# Patient Record
Sex: Female | Born: 1975 | Race: White | Hispanic: No | Marital: Married | Smoking: Former smoker
Health system: Southern US, Community
[De-identification: ages and names within clinical notes are randomized; demographics above are authoritative.]

## PROBLEM LIST (undated history)

## (undated) DIAGNOSIS — D649 Anemia, unspecified: Secondary | ICD-10-CM

## (undated) DIAGNOSIS — Z973 Presence of spectacles and contact lenses: Secondary | ICD-10-CM

## (undated) DIAGNOSIS — F329 Major depressive disorder, single episode, unspecified: Secondary | ICD-10-CM

## (undated) DIAGNOSIS — T7840XA Allergy, unspecified, initial encounter: Secondary | ICD-10-CM

## (undated) DIAGNOSIS — F419 Anxiety disorder, unspecified: Secondary | ICD-10-CM

## (undated) DIAGNOSIS — N920 Excessive and frequent menstruation with regular cycle: Secondary | ICD-10-CM

## (undated) DIAGNOSIS — R112 Nausea with vomiting, unspecified: Secondary | ICD-10-CM

## (undated) DIAGNOSIS — F32A Depression, unspecified: Secondary | ICD-10-CM

## (undated) DIAGNOSIS — J302 Other seasonal allergic rhinitis: Secondary | ICD-10-CM

## (undated) DIAGNOSIS — L309 Dermatitis, unspecified: Secondary | ICD-10-CM

## (undated) DIAGNOSIS — F411 Generalized anxiety disorder: Secondary | ICD-10-CM

## (undated) DIAGNOSIS — Z9889 Other specified postprocedural states: Secondary | ICD-10-CM

## (undated) HISTORY — DX: Depression, unspecified: F32.A

## (undated) HISTORY — DX: Major depressive disorder, single episode, unspecified: F32.9

## (undated) HISTORY — DX: Dermatitis, unspecified: L30.9

## (undated) HISTORY — PX: OTHER SURGICAL HISTORY: SHX169

## (undated) HISTORY — DX: Allergy, unspecified, initial encounter: T78.40XA

---

## 1997-11-26 ENCOUNTER — Emergency Department (HOSPITAL_COMMUNITY): Admission: EM | Admit: 1997-11-26 | Discharge: 1997-11-26 | Payer: Self-pay | Admitting: Emergency Medicine

## 2000-05-19 ENCOUNTER — Other Ambulatory Visit: Admission: RE | Admit: 2000-05-19 | Discharge: 2000-05-19 | Payer: Self-pay | Admitting: Obstetrics and Gynecology

## 2001-06-29 ENCOUNTER — Other Ambulatory Visit: Admission: RE | Admit: 2001-06-29 | Discharge: 2001-06-29 | Payer: Self-pay | Admitting: Obstetrics and Gynecology

## 2005-09-08 ENCOUNTER — Ambulatory Visit: Payer: Self-pay | Admitting: Internal Medicine

## 2005-10-05 ENCOUNTER — Ambulatory Visit: Payer: Self-pay | Admitting: Internal Medicine

## 2005-10-19 ENCOUNTER — Ambulatory Visit: Payer: Self-pay | Admitting: Internal Medicine

## 2006-08-25 ENCOUNTER — Ambulatory Visit: Payer: Self-pay | Admitting: Family Medicine

## 2006-09-22 ENCOUNTER — Ambulatory Visit: Payer: Self-pay | Admitting: Internal Medicine

## 2006-11-03 ENCOUNTER — Ambulatory Visit: Payer: Self-pay | Admitting: Internal Medicine

## 2007-07-27 ENCOUNTER — Ambulatory Visit: Payer: Self-pay | Admitting: Internal Medicine

## 2007-07-27 DIAGNOSIS — J019 Acute sinusitis, unspecified: Secondary | ICD-10-CM

## 2007-07-27 DIAGNOSIS — H6980 Other specified disorders of Eustachian tube, unspecified ear: Secondary | ICD-10-CM

## 2007-07-27 DIAGNOSIS — L259 Unspecified contact dermatitis, unspecified cause: Secondary | ICD-10-CM | POA: Insufficient documentation

## 2007-07-27 DIAGNOSIS — F3289 Other specified depressive episodes: Secondary | ICD-10-CM | POA: Insufficient documentation

## 2007-07-27 DIAGNOSIS — F329 Major depressive disorder, single episode, unspecified: Secondary | ICD-10-CM

## 2007-11-12 ENCOUNTER — Ambulatory Visit: Payer: Self-pay | Admitting: Internal Medicine

## 2007-11-12 DIAGNOSIS — H109 Unspecified conjunctivitis: Secondary | ICD-10-CM | POA: Insufficient documentation

## 2009-08-22 LAB — CONVERTED CEMR LAB: Pap Smear: NORMAL

## 2010-05-30 ENCOUNTER — Emergency Department (HOSPITAL_COMMUNITY): Admission: EM | Admit: 2010-05-30 | Discharge: 2010-05-30 | Payer: Self-pay | Admitting: Emergency Medicine

## 2010-06-01 ENCOUNTER — Telehealth: Payer: Self-pay | Admitting: Internal Medicine

## 2010-07-22 ENCOUNTER — Telehealth: Payer: Self-pay | Admitting: Internal Medicine

## 2010-08-06 ENCOUNTER — Ambulatory Visit: Payer: Self-pay | Admitting: Internal Medicine

## 2010-08-06 LAB — CONVERTED CEMR LAB
ALT: 10 units/L (ref 0–35)
AST: 11 units/L (ref 0–37)
Albumin: 3.4 g/dL — ABNORMAL LOW (ref 3.5–5.2)
Alkaline Phosphatase: 40 units/L (ref 39–117)
Basophils Relative: 0.5 % (ref 0.0–3.0)
Bilirubin, Direct: 0.1 mg/dL (ref 0.0–0.3)
CO2: 25 meq/L (ref 19–32)
Calcium: 8.7 mg/dL (ref 8.4–10.5)
Chloride: 110 meq/L (ref 96–112)
Creatinine, Ser: 0.6 mg/dL (ref 0.4–1.2)
Eosinophils Relative: 1.7 % (ref 0.0–5.0)
Hemoglobin: 13.1 g/dL (ref 12.0–15.0)
Ketones, ur: NEGATIVE mg/dL
LDL Cholesterol: 110 mg/dL — ABNORMAL HIGH (ref 0–99)
Leukocytes, UA: NEGATIVE
Lymphocytes Relative: 33.1 % (ref 12.0–46.0)
MCHC: 34.4 g/dL (ref 30.0–36.0)
Neutro Abs: 4.7 10*3/uL (ref 1.4–7.7)
Neutrophils Relative %: 57.2 % (ref 43.0–77.0)
Nitrite: NEGATIVE
RBC: 4.32 M/uL (ref 3.87–5.11)
Sodium: 141 meq/L (ref 135–145)
Specific Gravity, Urine: 1.025 (ref 1.000–1.030)
Total CHOL/HDL Ratio: 5
Total Protein: 6.3 g/dL (ref 6.0–8.3)
Urobilinogen, UA: 0.2 (ref 0.0–1.0)
WBC: 8.3 10*3/uL (ref 4.5–10.5)
pH: 5.5 (ref 5.0–8.0)

## 2010-08-12 ENCOUNTER — Ambulatory Visit: Payer: Self-pay | Admitting: Internal Medicine

## 2010-08-12 DIAGNOSIS — J309 Allergic rhinitis, unspecified: Secondary | ICD-10-CM | POA: Insufficient documentation

## 2010-08-12 DIAGNOSIS — E786 Lipoprotein deficiency: Secondary | ICD-10-CM | POA: Insufficient documentation

## 2010-09-21 NOTE — Progress Notes (Signed)
Summary: ED Visit on 05/31/10   Michele Andersen, Matalie Romberger - MRN: [id number] Acct#: [id number] PHYSICIAN DOCUMENTATION Derrick Ravel Oct 09 16:57:28 EDT 2011 [facility] [address] [place], [place] [address] PHONE: [phone] MRN: [id number] Account #: [id number] Name: Michele Andersen, Michele Andersen Transue Sex: F Age: 35 DOB: 1976-03-31 Complaint: Headache Primary Diagnosis: Headache Arrival Time: 05/30/2010 14:23 Discharge Time: 05/30/2010 16:56 All Providers: Ms. Jaynie Crumble - PA; Dr. Linwood Dibbles - MD PROVIDER: Ms. Jaynie Crumble - PA HPI: The patient is a 35 year old female who presents with a chief complaint of headache. The history was provided by the patient. The patient was seen at 03:02 PM. Pt states she has had headache for last 4 days. States she woke up with the headache, headache comes and goes, but is there most of the day. Headache mainly in the back of the head, also has neck stiffness. Denies fever, chills, URI symptoms, malaise, body aches, changes in vision, neurological complaints. States went to the UC, but sent here for CT scan due to pain in the back of the head, neck stiffness, and temp of 99.2. Pt admits that headache started with her menses, and states she always gets headache during her menstrual cycle. States though, they usually resolve in 1-2 days, and are nrever this long. Tried tylenol and ibuprofen, however, did not take anything today. The headache started 4 day(s) ago. The headache was preceded by nothing. The onset was unknown. The Pattern is persistent. The Course is persistent. The headache is located in the bilateral back of head. It is characterized as throbbing. The patient's pain was 3 out of 10 at its worst. The patient's pain is 3 out of 10 now. The symptoms are described as moderate. The condition is aggravated by nothing. The condition is relieved by nothing. The symptoms have been associated with stiff neck, while the symptoms have not been associated with  dizziness, fever, focal neurological deficit, nausea, photophobia, sinus pain, syncope, visual disturbance or vomiting. The patient has no significant history of serious medical conditions. 15:10 05/30/2010 by Jaynie Crumble - PA, Ms. ROS: Statement: all systems negative except as marked or noted in the HPI 15:26 05/30/2010 by Jaynie Crumble - PA, Ms. PMH: Documentation: physician assistant reviewed/amended Historian: patient Patient's Current Physicians Patient's Current Physicians (please list PCP first) Panosh - IM, Neta Mends Past medical history: none Social History: non-smoker, no drug abuse, occasional drinker 1 Michele Andersen, Tykisha Areola - MRN: [id number] Acct#: [id number] Allergies Drug Reaction Allergy Note Other (see note) thimeziol 15:10 05/30/2010 by Jaynie Crumble - PA, Ms. Home Medications: Documentation: physician assistant reviewed/amended Medications Medication [Medication] Dosage Frequency Last Dose Junel FE 1/20 (28) Oral once a day ZyrTEC Oral once a day 15:10 05/30/2010 by Jaynie Crumble - PA, Ms. Physical examination: Vital signs and O2 SAT: reviewed, normal Constitutional: well developed, well nourished, in no acute distress Head and Face: normocephalic, atraumatic Eyes: normal appearance, EOMI, PERRL, fundiscopic exam normal ENMT Exam: Lips: normal Mouth: mucosa membrane normal Throat: normal Nose: bilateral nasal mucosa normal Sinuses: bilateral normal External Ear: bilateral normal Ear canal: bilateral normal Tympanic membrane: bilateral normal Neck: full range of motion, no meningeal signs, no lymphadenopathy, no carotid bruit Spine: NOTE - mild tenderness over left back of the neck, no cervical midline tenderness Cardiovascular: regular rate and rhythm, no murmur, rub, or gallop Respiratory: breath sounds clear & equal bilaterally, no rales, rhonchi, wheezes, or rub Chest: nontender, no deformity Abdomen: soft, nontender, nondistended,  normal bowel sounds Extremities: normal Neuro: AA&Ox3,  Cranial Nerves II-XII intact, motor intact in all extremities, sensation normal , normal reflexes, gait normal, normal coordination, normal speech, no nystagmus, no facial droop, no pronator drift Skin: color normal, no rash, no petechiae, warm, dry Psychiatric: AA&Ox4, no abnormalities of mood or affect 15:26 05/30/2010 by Jaynie Crumble - PA, Ms. Reviewed result: Result Type: Cleda Daub: [id number] 2 Michele Andersen, Nekeshia Lenhardt - MRN: [id number] Acct#: [id number] Step Type: LAB Procedure Name: URINE MACROSCOPIC Procedure: URINE MACROSCOPIC Result: URINE COLOR YELLOW [YELLOW] URINE APPEARANCE CLEAR [CLEAR] URINE SPEC GRAVITY 1.006 [1.005-1.030] URINE PH 7.0 [5.0-8.0] URINE GLUCOSE NEGATIVE mg/dL [NEG] URINE HEMOGLOBIN NEGATIVE [NEG] URINE BILIRUBIN NEGATIVE [NEG] URINE KETONES NEGATIVE mg/dL [NEG] URINE TOTAL PROTEIN NEGATIVE mg/dL [NEG] URINE UROBILINOGEN 0.2 mg/dL [8.4-1.3] URINE NITRITE NEGATIVE [NEG] LEUKOCYTE ESTERASE TRACE [NEG] A 15:16 05/30/2010 by Jaynie Crumble - PA, Ms. Reviewed result: Result Type: Cleda Daub: [id number] Step Type: LAB Procedure Name: URINE MICROSCOPIC Procedure: URINE MICROSCOPIC Result: URINE EPITHELIAL RARE [RARE] URINE WBC'S 0-2 WBC/hpf [<3] URINE RBC'S 0-2 RBC/hpf [<3] 15:16 05/30/2010 by Jaynie Crumble - PA, Ms. Reviewed result: Result Type: Cleda Daub: [id number] Step Type: XRAY Procedure Name: CT HEAD W/O CM Procedure: CT HEAD W/O CM Result: Clinical Data: Headache. CT HEAD WITHOUT CONTRAST Technique: Contiguous axial images were obtained from the base of the skull through the vertex without intravenous contrast. 3 Michele Andersen, Lucynda Rosano - MRN: [id number] Acct#: [id number] Comparison: None. Findings: The brain appears normal without evidence of acute infarction, hemorrhage, mass lesion, mass effect, midline shift or abnormal extra-axial fluid collection.  No hydrocephalus. Calvarium intact. Imaged paranasal sinuses and mastoid air cells clear. IMPRESSION: Negative exam. 15:28 05/30/2010 by Jaynie Crumble - PA, Ms. Reviewed result: Result Type: Cleda Daub: [id number] Step Type: LAB Procedure Name: URINE PREGNANCY Procedure: URINE PREGNANCY Procedure Notes: URINE PREGNANCY - THE SENSITIVITY OF THIS METHODOLOGY IS >24 mIU/mL Result: URINE PREGNANCY NEGATIVE 15:53 05/30/2010 by Jaynie Crumble - PA, Ms. Patient disposition: Patient disposition: Disch - Home Primary Diagnosis: headache Counseling: advised of diagnosis, advised of treatment plan, advised of xray and lab findings, advised of need for close follow-up, advised of need to return for worsening or changing symptoms, patient voices understanding 16:25 05/30/2010 by Jaynie Crumble - PA, Ms. Prescriptions: Prescription Medication Dispense Sig Line ibuprofen 800 mg Tab Twelve (12) One tablet PO TIDTake it with meals UltRAM 50 mg Tab Twenty (20) One-Two tablet PO Q6hrs prn painDo not exceed 400 mg per day 16:25 05/30/2010 by Jaynie Crumble - PA, Ms. 4 Michele Andersen, Melannie Metzner - MRN: [id number] Acct#: [id number] Medication disposition: Medications Medication [Medication] Dosage Frequency Last Dose Medication disposition PCP contact Junel FE 1/20 (28) Oral once a day continue ZyrTEC Oral once a day continue 16:25 05/30/2010 by Jaynie Crumble - PA, Ms. Discharge: Discharge Instructions: *free text, headache Append a Note to Discharge Instructions: Your CT of the head is normal today. Take ultram and ibuprofen as prescribed as needed for pain. Drink plenty of fluids. Rest. Return immediatly if fever over 100, nausea, vomiting, increased pain, confusion, difficulty walking, talking, weakness, unable to move your neck. Follow up with your doctor. Referral/Appointment Refer Patient To: Phone Number: Follow-up in Appointment Details: Lajuana Ripple [phone] Drug Instructions: pain nsaid motrin, pain ultram 16:27 05/30/2010 by Jaynie Crumble - PA, Ms. PROVIDER: Dr. Linwood Dibbles - MD Attending: Supervision of: Midlevel: I have personally performed and participated in all the services and procedures documented herein. I have reviewed the findings with the patient. Comments: Pt feeling better after treatment.  Ready for discharge 16:37 05/30/2010 by Linwood Dibbles - MD, Dr. Bertram Millard

## 2010-09-21 NOTE — Progress Notes (Signed)
Summary: REQUEST FOR CPX APPT (Before end of 2011)  Phone Note Call from Patient   Caller: Patient  [phone] Summary of Call: Pt called to adv that she is leaving her job at the end of the year and wants to have a cpx before she leaves her job due to insurance ending.... Pt would like to be worked into schedule before end of year if possible.... Pt can be reached at [phone] to advise.  Initial call taken by: Debbra Riding,  July 22, 2010 9:31 AM  Follow-up for Phone Call        please make cpx appt. KIK Follow-up by: Duard Brady LPN,  July 22, 2010 12:20 PM  Additional Follow-up for Phone Call Additional follow up Details #1::        Okay to schedule pt anywhere I can find slots to combine for M30 appt / cpx ?  Additional Follow-up by: Debbra Riding,  July 22, 2010 2:39 PM    Additional Follow-up for Phone Call Additional follow up Details #2::    not on a friday afternoon or the  afternoon after a holiday   yes otherwise Follow-up by: Madelin Headings MD,  July 23, 2010 8:24 AM  Additional Follow-up for Phone Call Additional follow up Details #3:: Details for Additional Follow-up Action Taken: [facility] so appt can be scheduled for cpx.  Pt was scheduled for cpx labwork on 12/16.... cpx on 12/22, pt will arrive at 10:45am - adv no pap.Marland KitchenMarland KitchenPt acknowledged same.... Debbra Riding  July 23, 2010 11:08 AM  Additional Follow-up by: Debbra Riding,  July 23, 2010 9:31 AM   Appended Document: REQUEST FOR CPX APPT (Before end of 2011) The following note (flag) was sent to Dr Fabian Sharp:  This pt was scheduled for cpx on 12/22 .Marland Kitchen.. will arrive at 10:45am...Marland KitchenMarland Kitchen pt adv this is the only day that she has off (only available day to come in before end of year).  Please review when you get a chance and let me know if there is a problem so I can call pt to advise.  Thanks,  Bjorn Loser

## 2010-09-23 NOTE — Assessment & Plan Note (Signed)
Summary: CPX, NO PAP - PT WILL ARRIVE AT 10:45AM - OK PER DR [facility] // RS   Vital Signs:  Patient profile:   35 year old female Menstrual status:  regular LMP:     07/17/2010 Height:      63 inches Weight:      156 pounds BMI:     27.73 Pulse rate:   60 / minute BP sitting:   120 / 80  (left arm) Cuff size:   regular  Vitals Entered By: Romualdo Bolk, CMA Duncan Dull) (August 12, 2010 10:57 AM)  Nutrition Counseling: Patient's BMI is greater than 25 and therefore counseled on weight management options. CC: CPX without pap- Pt has a gyn who does paps. LMP (date): 07/17/2010     Menstrual Status regular Enter LMP: 07/17/2010 Last PAP Result normal   History of Present Illness: Michele Andersen comes in today  for preventive visit .  Since last visit  here  there have been no major changes in health status  .   Mood stable Skin see ros  does itch at times. On ocps  no concerns    getting married next month.  Preventive Care Screening  Pap Smear:    Date:  08/22/2009    Results:  normal    Preventive Screening-Counseling & Management  Alcohol-Tobacco     Alcohol drinks/day: <1     Alcohol type: beer     Smoking Status: quit     Year Quit: 2004  Caffeine-Diet-Exercise     Caffeine use/day: 1     Does Patient Exercise: no  Hep-HIV-STD-Contraception     Dental Visit-last 6 months yes     Sun Exposure-Excessive: no  Safety-Violence-Falls     Seat Belt Use: yes     Firearms in the Home: no firearms in the home     Smoke Detectors: yes  Current Medications (verified): 1)  Yaz 3-0.02 Mg  Tabs (Drospirenone-Ethinyl Estradiol) 2)  Zyrtec Hives Relief 10 Mg Tabs (Cetirizine Hcl)  Allergies (verified): 1)  ! * Thimerasol  Past History:  Past medical, surgical, family and social histories (including risk factors) reviewed, and no changes noted (except as noted below).  Past Medical History: Depression eczema  HA eval in ed  neg ct scan Allergic rhinitis fall     Past Surgical History: Reviewed history from 07/27/2007 and no changes required. Stitches-Rt pinkey Denies surgical history  Past History:  Care Management: Gynecology: [facility]  Family History: Reviewed history from 11/12/2007 and no changes required. [place] father and fathers side with dm both sides with elevated lipids  Social History: Reviewed history from 11/12/2007 and no changes required. 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   Alcohol use-yes Drug use-no Regular exercise-no HHof 2 and cat   no ets but hfiance smokes outside Caffeine use/day:  1 Dental Care w/in 6 mos.:  yes Sun Exposure-Excessive:  no Seat Belt Use:  yes  Review of Systems  The patient denies anorexia, fever, weight gain, vision loss, decreased hearing, hoarseness, chest pain, syncope, dyspnea on exertion, peripheral edema, prolonged cough, hemoptysis, abdominal pain, melena, hematochezia, severe indigestion/heartburn, hematuria, incontinence, muscle weakness, suspicious skin lesions, difficulty walking, unusual weight change, abnormal bleeding, enlarged lymph nodes, angioedema, and breast masses.         skin check moles  has had skin area aczema   and patch  on right  ankle derm said either ecz or psoriasis .  had HA and went to ed in October  felt from period  had neg ct scan.  depression is stable.  see hpi also  Physical Exam  General:  Well-developed,well-nourished,in no acute distress; alert,appropriate and cooperative throughout examination Head:  Normocephalic and atraumatic without obvious abnormalities. No apparent alopecia or balding. Eyes:  PERRL, EOMs full, conjunctiva clear  Ears:  External ear exam shows no significant lesions or deformities.  Otoscopic examination reveals clear canals, tympanic membranes are intact bilaterally without bulging, retraction, inflammation or discharge. Hearing is grossly  normal bilaterally. Nose:  no external deformity, no external erythema, and no nasal discharge.   Mouth:  good dentition and pharynx pink and moist.   Neck:  No deformities, masses, or tenderness noted. Breasts:  No mass, nodules, thickening, tenderness, bulging, retraction, inflamation, nipple discharge or skin changes noted.   Lungs:  Normal respiratory effort, chest expands symmetrically. Lungs are clear to auscultation, no crackles or wheezes.no dullness.   Heart:  Normal rate and regular rhythm. S1 and S2 normal without gallop, murmur, click, rub or other extra sounds.no lifts.   Abdomen:  Bowel sounds positive,abdomen soft and non-tender without masses, organomegaly or hernias noted. Genitalia:  per gyne Msk:  no joint tenderness, no joint warmth, and no redness over joints.   Pulses:  pulses intact without delay   Extremities:  no clubbing cyanosis or edema  Neurologic:  alert & oriented X3, strength normal in all extremities, gait normal, and DTRs symmetrical and normal.   Skin:  turgor normal and color normal.  sun frekling no susp lesions  right ankle wiht 4 cm round patch red and thickened  no silver scale   Cervical Nodes:  No lymphadenopathy noted Axillary Nodes:  No palpable lymphadenopathy Inguinal Nodes:  No significant adenopathy Psych:  Oriented X3, normally interactive, good eye contact, not anxious appearing, and not depressed appearing.     Impression & Recommendations:  Problem # 1:  Preventive Health Care (ICD-V70.0) Discussed nutrition,exercise,diet,healthy weight, vitamin D and calcium.   Problem # 2:  ECZEMA (ICD-692.9) vs psoriasis trail of steroid ointment Her updated medication list for this problem includes:    Zyrtec Hives Relief 10 Mg Tabs (Cetirizine hcl)    Mometasone Furoate 0.1 % Oint (Mometasone furoate) .Marland Kitchen... 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 Apply  to rash two times a day  as directed 3)  Junel 1.5/30 1.5-30 Mg-mcg Tabs (Norethindrone acet-ethinyl est) .... As per gyne  Other Orders: Tdap => 4yrs IM (16109) Admin 1st Vaccine (60454)  Patient Instructions: 1)  try steroid ointment right ankle as needed.  2)  healthy eating  avoiding transfats  saturated  fats refined sugars and carbs,  processed foods. 3)  exercise  can increase your HDL.  4)  Check up an follow up when needed.  5)  BMI goal guideline is 25 or below Prescriptions: MOMETASONE FUROATE 0.1 % OINT (MOMETASONE FUROATE) apply  to rash two times a day  as directed  #30 grm x 1   Entered and Authorized by:   Madelin Headings MD   Signed by:   Madelin Headings MD on 08/12/2010   Method used:   Electronically to        [facility]  [address]       [place], [place]  [address]       Ph: [phone]       Fax: [phone]   RxID:   [id number]    Orders Added: 1)  Tdap => 54yrs IM [90715] 2)  Admin 1st Vaccine [90471] 3)  Est. Patient 18-39 years [99395] 4)  Est. Patient Level II [40102]   Immunizations Administered:  Tetanus Vaccine:    Vaccine Type: Tdap    Site: right deltoid    Mfr: GlaxoSmithKline    Dose: 0.5 ml    Route: IM    Given by: Romualdo Bolk, CMA (AAMA)    Exp. Date: 06/10/2012    Lot #: [id number]   Immunizations Administered:  Tetanus Vaccine:    Vaccine Type: Tdap    Site: right deltoid    Mfr: GlaxoSmithKline    Dose: 0.5 ml    Route: IM    Given by: Romualdo Bolk, CMA (AAMA)    Exp. Date: 06/10/2012    Lot #: [id number]

## 2010-11-04 LAB — URINE MICROSCOPIC-ADD ON

## 2010-11-04 LAB — URINALYSIS, ROUTINE W REFLEX MICROSCOPIC
Hgb urine dipstick: NEGATIVE
Nitrite: NEGATIVE
Protein, ur: NEGATIVE mg/dL
Urobilinogen, UA: 0.2 mg/dL (ref 0.0–1.0)

## 2012-02-14 ENCOUNTER — Ambulatory Visit (INDEPENDENT_AMBULATORY_CARE_PROVIDER_SITE_OTHER): Payer: BC Managed Care – PPO | Admitting: Internal Medicine

## 2012-02-14 ENCOUNTER — Encounter: Payer: Self-pay | Admitting: Internal Medicine

## 2012-02-14 VITALS — BP 120/84 | HR 89 | Temp 98.6°F | Wt 162.0 lb

## 2012-02-14 DIAGNOSIS — M6740 Ganglion, unspecified site: Secondary | ICD-10-CM

## 2012-02-14 DIAGNOSIS — IMO0002 Reserved for concepts with insufficient information to code with codable children: Secondary | ICD-10-CM | POA: Insufficient documentation

## 2012-02-14 NOTE — Progress Notes (Signed)
  Subjective:    Patient ID: Michele Andersen, female    DOB: 01-01-1976, 36 y.o.   MRN: [id number]  HPI Patient comes in today  for  new problem evaluation. She has had about 2 months of a tender nodule at the base of her left ring finger palmar surface. It's gotten bigger slowly and is now becoming tender to touch and has pain and difficulty when gripping or such things such as the steering wheel in her car. She denies any trauma. She is right-handed. Is difficult to wear her rings on that side. Review of Systems Negative for hand trauma joint swelling. No numbness or particular weakness. Past history family history social history reviewed in the electronic medical record. Outpatient Encounter Prescriptions as of 02/14/2012  Medication Sig Dispense Refill  . cetirizine (ZYRTEC) 10 MG tablet Take 10 mg by mouth daily.      . Folic Acid-Vit B6-Vit B12 (FOLBEE) 2.5-25-1 MG TABS Take 1 tablet by mouth daily.      Marland Kitchen DISCONTD: norethindrone-ethinyl estradiol-iron (MICROGESTIN FE,GILDESS FE,LOESTRIN FE) 1.5-30 MG-MCG tablet Take 1 tablet by mouth daily.           Objective:   Physical Exam BP 120/84  Pulse 89  Temp 98.6 F (37 C) (Oral)  Wt 162 lb (73.483 kg)  SpO2 98%  LMP 01/26/2012  Well-developed well-nourished in no acute distress examination of left hand neurovascular appears intact no focal atrophy grip strength is good. There is a small modestly tender nodule at the base of left ring finger palmar surface with no deformity there's no redness. It seems to be mobile minimally.     Assessment & Plan:   Left palmar nodule finger ring possible ganglion causing symptoms and decreased function.  Refer to hand specialist discussion with patient will contact her about appointment.

## 2012-02-14 NOTE — Patient Instructions (Signed)
This may be a ganglion in a bad place . Someone will contact you about referral appt to a hand specialist.  Contact us prn in the meantime

## 2012-09-15 ENCOUNTER — Emergency Department (HOSPITAL_COMMUNITY): Payer: 59

## 2012-09-15 ENCOUNTER — Emergency Department (HOSPITAL_COMMUNITY)
Admission: EM | Admit: 2012-09-15 | Discharge: 2012-09-15 | Disposition: A | Discharge status: Home / Self Care | Payer: 59 | Attending: Emergency Medicine | Admitting: Emergency Medicine

## 2012-09-15 ENCOUNTER — Encounter (HOSPITAL_COMMUNITY): Payer: Self-pay | Admitting: *Deleted

## 2012-09-15 DIAGNOSIS — Z79899 Other long term (current) drug therapy: Secondary | ICD-10-CM | POA: Insufficient documentation

## 2012-09-15 DIAGNOSIS — Z8659 Personal history of other mental and behavioral disorders: Secondary | ICD-10-CM | POA: Insufficient documentation

## 2012-09-15 DIAGNOSIS — Z872 Personal history of diseases of the skin and subcutaneous tissue: Secondary | ICD-10-CM | POA: Insufficient documentation

## 2012-09-15 DIAGNOSIS — Z87891 Personal history of nicotine dependence: Secondary | ICD-10-CM | POA: Insufficient documentation

## 2012-09-15 DIAGNOSIS — N949 Unspecified condition associated with female genital organs and menstrual cycle: Secondary | ICD-10-CM | POA: Insufficient documentation

## 2012-09-15 DIAGNOSIS — R1031 Right lower quadrant pain: Secondary | ICD-10-CM | POA: Insufficient documentation

## 2012-09-15 DIAGNOSIS — Z3202 Encounter for pregnancy test, result negative: Secondary | ICD-10-CM | POA: Insufficient documentation

## 2012-09-15 LAB — URINALYSIS, ROUTINE W REFLEX MICROSCOPIC
Bilirubin Urine: NEGATIVE
Glucose, UA: NEGATIVE mg/dL
Hgb urine dipstick: NEGATIVE
Ketones, ur: NEGATIVE mg/dL
Protein, ur: NEGATIVE mg/dL

## 2012-09-15 LAB — POCT PREGNANCY, URINE: Preg Test, Ur: NEGATIVE

## 2012-09-15 LAB — CBC WITH DIFFERENTIAL/PLATELET
Eosinophils Absolute: 0.1 10*3/uL (ref 0.0–0.7)
Eosinophils Relative: 1 % (ref 0–5)
Hemoglobin: 13.6 g/dL (ref 12.0–15.0)
Lymphs Abs: 2.6 10*3/uL (ref 0.7–4.0)
MCH: 29.2 pg (ref 26.0–34.0)
MCV: 84.3 fL (ref 78.0–100.0)
Monocytes Relative: 7 % (ref 3–12)
RBC: 4.66 MIL/uL (ref 3.87–5.11)

## 2012-09-15 LAB — WET PREP, GENITAL: WBC, Wet Prep HPF POC: NONE SEEN

## 2012-09-15 LAB — BASIC METABOLIC PANEL
BUN: 7 mg/dL (ref 6–23)
CO2: 24 mEq/L (ref 19–32)
Calcium: 9.1 mg/dL (ref 8.4–10.5)
Glucose, Bld: 92 mg/dL (ref 70–99)
Potassium: 3.9 mEq/L (ref 3.5–5.1)
Sodium: 136 mEq/L (ref 135–145)

## 2012-09-15 MED ORDER — IBUPROFEN 800 MG PO TABS
800.0000 mg | ORAL_TABLET | Freq: Once | ORAL | Status: AC
  Administered 2012-09-15: 800 mg via ORAL
  Filled 2012-09-15: qty 1

## 2012-09-15 MED ORDER — TRAMADOL HCL 50 MG PO TABS: 50.0000 mg | ORAL_TABLET | Freq: Four times a day (QID) | ORAL | Status: DC | PRN

## 2012-09-15 NOTE — ED Provider Notes (Signed)
History     CSN: [id number]  Arrival date & time 09/15/12  1191   First MD Initiated Contact with Patient 09/15/12 [phone]      Chief Complaint  Patient presents with  . Abdominal Pain    (Consider location/radiation/quality/duration/timing/severity/associated sxs/prior treatment) HPI Comments: Patient is a 37 y/o female presenting to the ED c/o gradual onset abdominal pain beginning late last night. Patient noticed a full feeling described as "cramping" in the right lower quadrant yesterday. Pain is intermittent, sharp, non-radiating, rated as 5/10. Pain aggravated by movement. Nothing in specific alleviates her pain. She began her menses yesterday. Patient states that she took a course of Clomed in October and November of the past year since she and her husband are trying to get pregnant.  Patient's past medical history is significant for irregular periods and metorrhagia. Denies history of ovarian cysts. Denies vaginal pain or discharge, urinary symptoms, fever, chills, nausea, vomiting, diarrhea or appetite changes.  Patient is a 37 y.o. female presenting with abdominal pain. The history is provided by the patient. No language interpreter was used.  Abdominal Pain The primary symptoms of the illness include abdominal pain. The primary symptoms of the illness do not include fever, shortness of breath, nausea, vomiting, diarrhea, dysuria, vaginal discharge or vaginal bleeding.  Symptoms associated with the illness do not include chills, constipation, urgency, hematuria, frequency or back pain.    Past Medical History  Diagnosis Date  . Depression   . Eczema   . Allergy     History reviewed. No pertinent past surgical history.  Family History  Problem Relation Age of Onset  . Hyperlipidemia Mother   . Diabetes Father   . Hyperlipidemia Father     History  Substance Use Topics  . Smoking status: Former Games developer  . Smokeless tobacco: Not on file  . Alcohol Use: Yes    OB  History    Grav Para Term Preterm Abortions TAB SAB Ect Mult Living                  Review of Systems  Constitutional: Negative for fever, chills and appetite change.  Respiratory: Negative for shortness of breath.   Cardiovascular: Negative for chest pain.  Gastrointestinal: Positive for abdominal pain. Negative for nausea, vomiting, diarrhea and constipation.  Genitourinary: Negative for dysuria, urgency, frequency, hematuria, vaginal bleeding, vaginal discharge, vaginal pain and pelvic pain.  Musculoskeletal: Negative for back pain.  Neurological: Negative for dizziness, weakness and light-headedness.  All other systems reviewed and are negative.    Allergies  Thimerosal  Home Medications   Current Outpatient Rx  Name  Route  Sig  Dispense  Refill  . CETIRIZINE HCL 10 MG PO TABS   Oral   Take 10 mg by mouth daily.         Marland Kitchen FOLIC ACID-VIT B6-VIT B12 2.5-25-1 MG PO TABS   Oral   Take 1 tablet by mouth daily.           BP 128/78  Pulse 80  Temp 98.8 F (37.1 C) (Oral)  Resp 20  Ht 5\' 3"  (1.6 m)  Wt 160 lb (72.576 kg)  BMI 28.34 kg/m2  SpO2 99%  LMP 09/14/2012  Physical Exam  Nursing note and vitals reviewed. Constitutional: She is oriented to person, place, and time. She appears well-developed and well-nourished. No distress.  HENT:  Head: Normocephalic and atraumatic.  Eyes: Conjunctivae normal and EOM are normal. No scleral icterus.  Neck: Normal range of motion.  Neck supple.  Cardiovascular: Normal rate, regular rhythm and normal heart sounds.   Pulmonary/Chest: Effort normal and breath sounds normal.  Abdominal: Soft. Bowel sounds are normal. She exhibits no distension and no mass. There is tenderness in the right lower quadrant. There is no rigidity, no rebound, no guarding and no tenderness at McBurney's point.  Genitourinary: Uterus normal. Uterus is not enlarged and not tender. Cervix exhibits discharge (bloody). Cervix exhibits no motion  tenderness and no friability. Right adnexum displays tenderness. Right adnexum displays no mass and no fullness. Left adnexum displays no mass, no tenderness and no fullness. No erythema, tenderness or bleeding around the vagina. No vaginal discharge found.       Bleeding from cervical os consistent with menstruation.  Musculoskeletal: Normal range of motion. She exhibits no edema.  Neurological: She is alert and oriented to person, place, and time.  Skin: Skin is warm and dry. She is not diaphoretic.  Psychiatric: She has a normal mood and affect. Her behavior is normal.    ED Course  Procedures (including critical care time)  Labs Reviewed  CBC WITH DIFFERENTIAL - Abnormal; Notable for the following:    WBC 11.5 (*)     Neutro Abs 7.9 (*)     All other components within normal limits  WET PREP, GENITAL - Abnormal; Notable for the following:    Yeast Wet Prep HPF POC FEW (*)     All other components within normal limits  BASIC METABOLIC PANEL  URINALYSIS, ROUTINE W REFLEX MICROSCOPIC  POCT PREGNANCY, URINE  GC/CHLAMYDIA PROBE AMP   No results found.   No diagnosis found.    MDM  37 y/o female with RLQ abdominal pain. She is trying to get pregnant with her husband with Clomid challenge. Right adnexal tenderness on pelvic exam. Pain not consistent with appendicitis. Patient afebrile, no rebound or guarding, normal appetite, no nausea or vomiting. Awaiting pelvic US results. Case discussed with Wynetta Emery, PA-C who will take over care of patient at this time.       Trevor Mace, PA-C 09/15/12 1207

## 2012-09-15 NOTE — ED Provider Notes (Signed)
Medical screening examination/treatment/procedure(s) were performed by non-physician practitioner and as supervising physician I was immediately available for consultation/collaboration.   Celene Kras, MD 09/15/12 [phone]

## 2012-09-15 NOTE — ED Notes (Signed)
Sharp pain in rt lower quad, reports swollen lymph nodes in area also, no other symptoms noted

## 2012-09-15 NOTE — ED Provider Notes (Signed)
Michele Andersen is a 37 y.o. female signed out from Georgia Albert at shift change as follows: Patient with mild right lower quadrant pain well controlled with Motrin pending transvaginal ultrasound to rule out ovarian cyst.  Patient seen and examined at the bedside: She is resting comfortably with no complaints. Abdominal exam is benign with no tenderness to palpation, guarding or rebound.   Discussed results of ultrasound, return precautions given, outpatient followup with PCP or OB/GYN.  New Prescriptions   TRAMADOL (ULTRAM) 50 MG TABLET    Take 1 tablet (50 mg total) by mouth every 6 (six) hours as needed for pain.    Wynetta Emery, PA-C 09/15/12 1329

## 2012-09-16 LAB — GC/CHLAMYDIA PROBE AMP: CT Probe RNA: NEGATIVE

## 2012-11-06 ENCOUNTER — Ambulatory Visit (INDEPENDENT_AMBULATORY_CARE_PROVIDER_SITE_OTHER): Payer: 59 | Admitting: Internal Medicine

## 2012-11-06 ENCOUNTER — Encounter: Payer: Self-pay | Admitting: Internal Medicine

## 2012-11-06 VITALS — BP 130/100 | HR 85 | Temp 99.7°F | Wt 157.0 lb

## 2012-11-06 DIAGNOSIS — J069 Acute upper respiratory infection, unspecified: Secondary | ICD-10-CM

## 2012-11-06 NOTE — Patient Instructions (Addendum)
You have a viral respiratory infection   that will probably last another week or so. Continue decongestants and mucin ex as helps and fluids and nasal saline .   Care  with afrin nose spray to avoid rebound .   No ear infection  Today and lung exam is good at this time. Advise  Fluids  2 day of voice rest to help the laryngitis  Calm down.         Laryngitis At the top of your windpipe is your voice box. It is the source of your voice. Inside your voice box are 2 bands of muscles called vocal cords. When you breathe, your vocal cords are relaxed and open so that air can get into the lungs. When you decide to say something, these cords come together and vibrate. The sound from these vibrations goes into your throat and comes out through your mouth as sound. Laryngitis is an inflammation of the vocal cords that causes hoarseness, cough, loss of voice, sore throat, and dry throat. Laryngitis can be temporary (acute) or long-term (chronic). Most cases of acute laryngitis improve with time.Chronic laryngitis lasts for more than 3 weeks. CAUSES Laryngitis can often be related to excessive smoking, talking, or yelling, as well as inhalation of toxic fumes and allergies. Acute laryngitis is usually caused by a viral infection, vocal strain, measles or mumps, or bacterial infections. Chronic laryngitis is usually caused by vocal cord strain, vocal cord injury, postnasal drip, growths on the vocal cords, or acid reflux. SYMPTOMS   Cough.  Sore throat.  Dry throat. RISK FACTORS  Respiratory infections.  Exposure to irritating substances, such as cigarette smoke, excessive amounts of alcohol, stomach acids, and workplace chemicals.  Voice trauma, such as vocal cord injury from shouting or speaking too loud. DIAGNOSIS  Your cargiver will perform a physical exam. During the physical exam, your caregiver will examine your throat. The most common sign of laryngitis is hoarseness. Laryngoscopy may be  necessary to confirm the diagnosis of this condition. This procedure allows your caregiver to look into the larynx. HOME CARE INSTRUCTIONS  Drink enough fluids to keep your urine clear or pale yellow.  Rest until you no longer have symptoms or as directed by your caregiver.  Breathe in moist air.  Take all medicine as directed by your caregiver.  Do not smoke.  Talk as little as possible (this includes whispering).  Write on paper instead of talking until your voice is back to normal.  Follow up with your caregiver if your condition has not improved after 10 days. SEEK MEDICAL CARE IF:   You have trouble breathing.  You cough up blood.  You have persistent fever.  You have increasing pain.  You have difficulty swallowing. MAKE SURE YOU:  Understand these instructions.  Will watch your condition.  Will get help right away if you are not doing well or get worse. Document Released: 08/08/2005 Document Revised: 10/31/2011 Document Reviewed: 10/14/2010 [facility] Patient Information 2013 [place], [place].

## 2012-11-06 NOTE — Progress Notes (Signed)
Chief Complaint  Patient presents with  . Hoarse    Started Friday.  Treating with with Sudafed, Delsym, Benadry, Advil and Mucinex.  . Nasal Congestion  . Cough  . Otalgia    HPI: Patient comes in today for SDA for  new problem evaluation.  Onset  March 14  And   Above sx and low grade fever .     Throat and pressure in sinuses   . Congested and now quite hoarse with intermittent coughing. 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 Needs a note for work in an FMLA form if any accommodation as needed. Tried many meds but sudafed the best.    Nothing helps the voice .  Mucous     ROS: See pertinent positives and negatives per HPI. No specific chest pain shortness of breath hemoptysis or severe face pain. No significant nocturnal cough.  Some of the phlegm is discolored wonders if she has an infection.  Past Medical History  Diagnosis Date  . Depression   . Eczema   . Allergy     Family History  Problem Relation Age of Onset  . Hyperlipidemia Mother   . Diabetes Father   . Hyperlipidemia Father     History   Social History  . Marital Status: Married    Spouse Name: N/A    Number of Children: N/A  . Years of Education: N/A   Social History Main Topics  . Smoking status: Former Games developer  . Smokeless tobacco: None  . Alcohol Use: Yes  . Drug Use: No  . Sexually Active: None   Other Topics Concern  . None   Social History Narrative  . None    Outpatient Encounter Prescriptions as of 11/06/2012  Medication Sig Dispense Refill  . cetirizine (ZYRTEC) 10 MG tablet Take 10 mg by mouth daily as needed. For allergies      . Multiple Vitamin (MULTIVITAMIN WITH MINERALS) TABS Take 1 tablet by mouth daily.      . progesterone (PROMETRIUM) 100 MG capsule Take 100 mg by mouth at bedtime. Take capsule on days 15 through 30      . traMADol (ULTRAM) 50 MG tablet  Take 1 tablet (50 mg total) by mouth every 6 (six) hours as needed for pain.  15 tablet  0   No facility-administered encounter medications on file as of 11/06/2012.    EXAM:  BP 130/100  Pulse 85  Temp(Src) 99.7 F (37.6 C) (Oral)  Wt 157 lb (71.215 kg)  BMI 27.82 kg/m2  SpO2 99%  LMP 10/12/2012  Body mass index is 27.82 kg/(m^2).  GENERAL: vitals reviewed and listed above, alert, oriented, appears well hydrated and in no acute distress she is quite congested and moderately hoarse but no stridor or respiratory difficulty. Nontoxic in appearance  HEENT: atraumatic, conjunctiva  clear, no obvious abnormalities on inspection of external nose and ears significant nasal congestion face nontender to tap but does feel pressure TMs have normal landmarks OP : no lesion edema or exudate mild erythema.  NECK: no obvious masses on inspection palpation supple without adenopathy  LUNGS: clear to auscultation bilaterally, no wheezes, rales or rhonchi, good air movement  CV: HRRR, no clubbing cyanosis or  peripheral edema nl cap refill   MS: moves all extremities without noticeable focal  abnormality  PSYCH: pleasant and cooperative,  no obvious depression or anxiety  ASSESSMENT AND PLAN:  Discussed the following assessment and plan:  Acute laryngitis - prob viral  URI, acute Expectant management relative voice rest note written for work including her FMLA form required by her employer. Measures discussed warm liquids contact us in followup if unexpected course complications arise. Symptoms may last another week or so. Can call if wishes  Cough medication  . -Patient advised to return or notify health care team  if symptoms worsen or persist or new concerns arise. Total visit > 50% spent counseling and coordinating care  Form completion and letter writing .    Patient Instructions  You have a viral respiratory infection   that will probably last another week or so. Continue  decongestants and mucin ex as helps and fluids and nasal saline .   Care  with afrin nose spray to avoid rebound .   No ear infection  Today and lung exam is good at this time. Advise  Fluids  2 day of voice rest to help the laryngitis  Calm down.         Laryngitis At the top of your windpipe is your voice box. It is the source of your voice. Inside your voice box are 2 bands of muscles called vocal cords. When you breathe, your vocal cords are relaxed and open so that air can get into the lungs. When you decide to say something, these cords come together and vibrate. The sound from these vibrations goes into your throat and comes out through your mouth as sound. Laryngitis is an inflammation of the vocal cords that causes hoarseness, cough, loss of voice, sore throat, and dry throat. Laryngitis can be temporary (acute) or long-term (chronic). Most cases of acute laryngitis improve with time.Chronic laryngitis lasts for more than 3 weeks. CAUSES Laryngitis can often be related to excessive smoking, talking, or yelling, as well as inhalation of toxic fumes and allergies. Acute laryngitis is usually caused by a viral infection, vocal strain, measles or mumps, or bacterial infections. Chronic laryngitis is usually caused by vocal cord strain, vocal cord injury, postnasal drip, growths on the vocal cords, or acid reflux. SYMPTOMS   Cough.  Sore throat.  Dry throat. RISK FACTORS  Respiratory infections.  Exposure to irritating substances, such as cigarette smoke, excessive amounts of alcohol, stomach acids, and workplace chemicals.  Voice trauma, such as vocal cord injury from shouting or speaking too loud. DIAGNOSIS  Your cargiver will perform a physical exam. During the physical exam, your caregiver will examine your throat. The most common sign of laryngitis is hoarseness. Laryngoscopy may be necessary to confirm the diagnosis of this condition. This procedure allows your caregiver to look  into the larynx. HOME CARE INSTRUCTIONS  Drink enough fluids to keep your urine clear or pale yellow.  Rest until you no longer have symptoms or as directed by your caregiver.  Breathe in moist air.  Take all medicine as directed by your caregiver.  Do not smoke.  Talk as little as possible (this includes whispering).  Write on paper instead of talking until your voice is back to normal.  Follow up with your caregiver if your condition has not improved after 10 days. SEEK MEDICAL CARE IF:   You have trouble breathing.  You cough up blood.  You have persistent fever.  You have increasing pain.  You have difficulty swallowing. MAKE SURE YOU:  Understand these instructions.  Will watch your condition.  Will get  help right away if you are not doing well or get worse. Document Released: 08/08/2005 Document Revised: 10/31/2011 Document Reviewed: 10/14/2010 [facility] Patient Information 2013 [place], [place].    Neta Mends. Michele Andersen M.D.

## 2012-11-09 ENCOUNTER — Telehealth: Payer: Self-pay | Admitting: Internal Medicine

## 2012-11-09 MED ORDER — HYDROCODONE-HOMATROPINE 5-1.5 MG/5ML PO SYRP: ORAL_SOLUTION | ORAL | Status: DC

## 2012-11-09 NOTE — Telephone Encounter (Signed)
Message left on personally identified home number informing the pt to pick up rx at [facility].

## 2012-11-09 NOTE — Telephone Encounter (Signed)
Please send in hydodan cough syrup 180 cc 1 tsp q 4-6 hours prn cough no refills

## 2012-11-09 NOTE — Telephone Encounter (Signed)
Pt was seen on 3/18 and Dr. Fabian Sharp stated that if her symptoms hadn't cleared up, that she could call for a cough syrup. Pt is requesting it be sent to [facility] on [address]

## 2012-12-10 DIAGNOSIS — Z0279 Encounter for issue of other medical certificate: Secondary | ICD-10-CM

## 2012-12-12 ENCOUNTER — Telehealth: Payer: Self-pay | Admitting: Family Medicine

## 2012-12-12 NOTE — Telephone Encounter (Signed)
Patient is calling to check on the status of her paperwork.  She needs this by the end of the week.  She can be contacted at [phone].  May leave a detailed message if she is not available.

## 2012-12-12 NOTE — Telephone Encounter (Signed)
Misty form is on your desk needs copy and get to wherever it should go .

## 2012-12-13 NOTE — Telephone Encounter (Signed)
Left message on number provided for the pt to pick up the paperwork at the front desk.

## 2014-03-31 ENCOUNTER — Encounter: Payer: Self-pay | Admitting: Physician Assistant

## 2014-03-31 ENCOUNTER — Ambulatory Visit (INDEPENDENT_AMBULATORY_CARE_PROVIDER_SITE_OTHER): Payer: 59 | Admitting: Physician Assistant

## 2014-03-31 VITALS — BP 124/80 | HR 72 | Temp 98.5°F | Resp 18 | Wt 163.0 lb

## 2014-03-31 DIAGNOSIS — R21 Rash and other nonspecific skin eruption: Secondary | ICD-10-CM

## 2014-03-31 MED ORDER — TRIAMCINOLONE ACETONIDE 0.1 % EX CREA: 1.0000 "application " | TOPICAL_CREAM | Freq: Two times a day (BID) | CUTANEOUS | Status: DC

## 2014-03-31 NOTE — Progress Notes (Signed)
Subjective:    Patient ID: Michele Andersen, female    DOB: 04/22/1976, 38 y.o.   MRN: [id number]  Rash This is a new problem. The current episode started 1 to 4 weeks ago (1 month). The problem has been gradually worsening since onset. The affected locations include the torso, left arm, left lower leg, left upper leg, right arm, right upper leg, right lower leg, neck and chest. The rash is characterized by itchiness, dryness, scaling and redness. Pertinent negatives include no anorexia, congestion, cough, diarrhea, eye pain, facial edema, fatigue, fever, joint pain, nail changes, rhinorrhea, shortness of breath, sore throat or vomiting. Treatments tried: lotrimin ultra cream, clotrimazole. The treatment provided moderate relief. Her past medical history is significant for allergies and eczema. There is no history of asthma.      Review of Systems  Constitutional: Negative for fever, chills and fatigue.  HENT: Negative for congestion, rhinorrhea and sore throat.   Eyes: Negative for pain.  Respiratory: Negative for cough and shortness of breath.   Cardiovascular: Negative for chest pain.  Gastrointestinal: Negative for nausea, vomiting, diarrhea and anorexia.  Musculoskeletal: Negative for joint pain.  Skin: Positive for rash. Negative for nail changes.  Neurological: Negative for syncope and headaches.  All other systems reviewed and are negative.   Past Medical History  Diagnosis Date  . Depression   . Eczema   . Allergy     History   Social History  . Marital Status: Married    Spouse Name: N/A    Number of Children: N/A  . Years of Education: N/A   Occupational History  . Not on file.   Social History Main Topics  . Smoking status: Former Research scientist (life sciences)  . Smokeless tobacco: Not on file  . Alcohol Use: Yes  . Drug Use: No  . Sexual Activity: Not on file   Other Topics Concern  . Not on file   Social History Narrative  . No narrative on file    History  reviewed. No pertinent past surgical history.  Family History  Problem Relation Age of Onset  . Hyperlipidemia Mother   . Diabetes Father   . Hyperlipidemia Father     Allergies  Allergen Reactions  . Thimerosal Itching    Current Outpatient Prescriptions on File Prior to Visit  Medication Sig Dispense Refill  . cetirizine (ZYRTEC) 10 MG tablet Take 10 mg by mouth daily as needed. For allergies      . Multiple Vitamin (MULTIVITAMIN WITH MINERALS) TABS Take 1 tablet by mouth daily.       No current facility-administered medications on file prior to visit.    EXAM: BP 124/80  Pulse 72  Temp(Src) 98.5 F (36.9 C) (Oral)  Resp 18  Wt 163 lb (73.936 kg)  LMP 03/10/2014      Objective:   Physical Exam  Nursing note and vitals reviewed. Constitutional: She is oriented to person, place, and time. She appears well-developed and well-nourished. No distress.  HENT:  Head: Normocephalic and atraumatic.  Eyes: Conjunctivae and EOM are normal. Pupils are equal, round, and reactive to light.  Cardiovascular: Normal rate, regular rhythm and intact distal pulses.   Pulmonary/Chest: Effort normal and breath sounds normal. No respiratory distress.  Neurological: She is alert and oriented to person, place, and time.  Skin: Skin is warm and dry. Rash noted. She is not diaphoretic. No pallor.  There is a diffuse annular nonraised erythematous rash over portions of the abdomen, chest, neck, back,  bilateral legs, bilateral arms. The areas do not appear to be flaking. These are nontender to palpation, not fluctuant, not warm to the touch. No breakage of skin or puncture visible.  Psychiatric: She has a normal mood and affect. Her behavior is normal. Judgment and thought content normal.     Lab Results  Component Value Date   WBC 11.5* 09/15/2012   HGB 13.6 09/15/2012   HCT 39.3 09/15/2012   PLT 336 09/15/2012   GLUCOSE 92 09/15/2012   CHOL 169 08/06/2010   TRIG 124.0 08/06/2010   HDL  34.10* 08/06/2010   LDLCALC 110* 08/06/2010   ALT 10 08/06/2010   AST 11 08/06/2010   NA 136 09/15/2012   K 3.9 09/15/2012   CL 102 09/15/2012   CREATININE 0.54 09/15/2012   BUN 7 09/15/2012   CO2 24 09/15/2012   TSH 3.16 08/06/2010        Assessment & Plan:  Michele Andersen was seen today for rash.  Diagnoses and associated orders for this visit:  Rash and nonspecific skin eruption Comments: Will try topical triamcinolone to see if this helps. refer to Derm if no improvement. - triamcinolone cream (KENALOG) 0.1 %; Apply 1 application topically 2 (two) times daily.    Return precautions provided, and patient handout on rash.  Plan to follow up as needed, or for worsening or persistent symptoms despite treatment.  Patient Instructions   We will try a topical triamcinolone cream to see if this provides any significant relief.   In one week if symptoms do not seem to be improving, we will refer you to a dermatologist for their opinion.  If emergency symptoms discussed during visit developed, seek medical attention immediately.  Followup as needed, or for worsening or persistent symptoms despite treatment.

## 2014-03-31 NOTE — Patient Instructions (Addendum)
We will try a topical triamcinolone cream to see if this provides any significant relief.   In one week if symptoms do not seem to be improving, we will refer you to a dermatologist for their opinion.  If emergency symptoms discussed during visit developed, seek medical attention immediately.  Followup as needed, or for worsening or persistent symptoms despite treatment.     Rash A rash is a change in the color or feel of your skin. There are many different types of rashes. You may have other problems along with your rash. HOME CARE  Avoid the thing that caused your rash.  Do not scratch your rash.  You may take cools baths to help stop itching.  Only take medicines as told by your doctor.  Keep all doctor visits as told. GET HELP RIGHT AWAY IF:   Your pain, puffiness (swelling), or redness gets worse.  You have a fever.  You have new or severe problems.  You have body aches, watery poop (diarrhea), or you throw up (vomit).  Your rash is not better after 3 days. MAKE SURE YOU:   Understand these instructions.  Will watch your condition.  Will get help right away if you are not doing well or get worse. Document Released: 01/25/2008 Document Revised: 10/31/2011 Document Reviewed: 05/23/2011 [facility] Patient Information 2015 [place], [place]. This information is not intended to replace advice given to you by your health care provider. Make sure you discuss any questions you have with your health care provider.

## 2014-04-03 ENCOUNTER — Other Ambulatory Visit: Payer: Self-pay | Admitting: Physician Assistant

## 2014-04-03 ENCOUNTER — Telehealth: Payer: Self-pay | Admitting: Internal Medicine

## 2014-04-03 DIAGNOSIS — R21 Rash and other nonspecific skin eruption: Secondary | ICD-10-CM

## 2014-04-03 NOTE — Telephone Encounter (Signed)
Pt wants to make you aware that the rx triamcinolone cream (KENALOG) 0.1 %, however she still has new spots continue to come up. Pt wants to know if she should see a dermatologist.

## 2014-04-03 NOTE — Telephone Encounter (Signed)
Left a detailed message for pt that referral placed and pt will be called to schedule an appt.

## 2014-04-03 NOTE — Telephone Encounter (Signed)
Please call pt. Let her know that Absolutely she can see a dermatologist. (I placed the referral)

## 2015-01-28 ENCOUNTER — Other Ambulatory Visit: Payer: Self-pay | Admitting: Orthopaedic Surgery

## 2015-01-28 DIAGNOSIS — M25521 Pain in right elbow: Secondary | ICD-10-CM

## 2015-01-28 DIAGNOSIS — M25531 Pain in right wrist: Secondary | ICD-10-CM

## 2015-02-13 ENCOUNTER — Ambulatory Visit
Admission: RE | Admit: 2015-02-13 | Discharge: 2015-02-13 | Disposition: A | Discharge status: Home / Self Care | Payer: Self-pay | Source: Ambulatory Visit | Attending: Orthopaedic Surgery | Admitting: Orthopaedic Surgery

## 2015-02-13 ENCOUNTER — Ambulatory Visit
Admission: RE | Admit: 2015-02-13 | Discharge: 2015-02-13 | Disposition: A | Discharge status: Home / Self Care | Payer: 59 | Source: Ambulatory Visit | Attending: Orthopaedic Surgery | Admitting: Orthopaedic Surgery

## 2015-02-13 DIAGNOSIS — M25531 Pain in right wrist: Secondary | ICD-10-CM

## 2015-02-13 DIAGNOSIS — M25521 Pain in right elbow: Secondary | ICD-10-CM

## 2015-08-25 ENCOUNTER — Telehealth: Payer: Self-pay | Admitting: Internal Medicine

## 2015-08-25 NOTE — Telephone Encounter (Signed)
Patient Name: Michele Andersen  DOB: 10-18-1975    Initial Comment Caller states has cold nearly 2 wks; bad cough, congestion; no fever; lingering;    Nurse Assessment      Guidelines    Guideline Title Affirmed Question Affirmed Notes       Final Disposition User   FINAL ATTEMPT MADE - message left [name], Therapist, sports, Vanita Ingles

## 2015-08-25 NOTE — Telephone Encounter (Signed)
FYI

## 2015-12-07 HISTORY — PX: ELBOW SURGERY: SHX618

## 2016-07-20 ENCOUNTER — Telehealth: Payer: Self-pay | Admitting: Internal Medicine

## 2016-07-20 NOTE — Telephone Encounter (Signed)
° ° ° °  Pt call to schedule an appt but she has not send Dr Regis Bill in over 3 years. She said she want to discuss depression

## 2016-07-25 NOTE — Telephone Encounter (Signed)
She has been seen in our practice   By Bebe Liter in 2015  Last by me 2014  I can see her for 30 minute appt but not until January be cause I am out of office  And  Holiday schedule  Constraints.  She can eight get appt 30 min for JAN   Or  Get appt with other provider    Earlier than that .

## 2016-07-27 NOTE — Telephone Encounter (Signed)
° ° ° ° °  left message for pt to call and schedule an appt for January per Dr Regis Bill

## 2016-08-22 DIAGNOSIS — R002 Palpitations: Secondary | ICD-10-CM

## 2016-08-22 HISTORY — DX: Palpitations: R00.2

## 2017-05-12 ENCOUNTER — Encounter: Payer: Self-pay | Admitting: Internal Medicine

## 2018-01-26 ENCOUNTER — Ambulatory Visit (INDEPENDENT_AMBULATORY_CARE_PROVIDER_SITE_OTHER): Payer: 59 | Admitting: Family Medicine

## 2018-01-26 ENCOUNTER — Encounter: Payer: Self-pay | Admitting: Family Medicine

## 2018-01-26 VITALS — BP 108/70 | HR 74 | Temp 98.4°F | Ht 63.0 in | Wt 171.6 lb

## 2018-01-26 DIAGNOSIS — L03311 Cellulitis of abdominal wall: Secondary | ICD-10-CM

## 2018-01-26 MED ORDER — DOXYCYCLINE HYCLATE 100 MG PO CAPS: 100.0000 mg | ORAL_CAPSULE | Freq: Two times a day (BID) | ORAL | 0 refills | Status: AC

## 2018-01-26 NOTE — Progress Notes (Signed)
   Subjective:    Patient ID: Michele Andersen, female    DOB: 1976/02/10, 42 y.o.   MRN: [id number]  HPI Here for an area of redness on the abdomen around the site of a wasp sting. This occurred a week ago. She has been applying Bacitracin and taking Zyrtec. On the last 48 hours a zone of redness has appeared around the site and it is spreading. No fever.    Review of Systems  Constitutional: Negative.   Respiratory: Negative.   Cardiovascular: Negative.   Skin: Positive for color change.  Neurological: Negative.        Objective:   Physical Exam  Constitutional: She is oriented to person, place, and time. She appears well-developed and well-nourished.  Cardiovascular: Normal rate, regular rhythm, normal heart sounds and intact distal pulses.  Pulmonary/Chest: Effort normal and breath sounds normal.  Neurological: She is alert and oriented to person, place, and time.  Skin:  The lower left abdomen has a small puncture mark surrounded by an 8 cm zone of raised erythema. This is warm but not tender           Assessment & Plan:  Cellulitis from a wasp sting. Treat with Doxycycline for 10 days.  Alysia Penna, MD

## 2018-08-17 ENCOUNTER — Ambulatory Visit (INDEPENDENT_AMBULATORY_CARE_PROVIDER_SITE_OTHER): Payer: 59 | Admitting: Family Medicine

## 2018-08-17 ENCOUNTER — Encounter: Payer: Self-pay | Admitting: Family Medicine

## 2018-08-17 VITALS — BP 120/80 | HR 85 | Temp 98.7°F | Wt 169.4 lb

## 2018-08-17 DIAGNOSIS — R062 Wheezing: Secondary | ICD-10-CM

## 2018-08-17 DIAGNOSIS — J189 Pneumonia, unspecified organism: Secondary | ICD-10-CM

## 2018-08-17 DIAGNOSIS — J181 Lobar pneumonia, unspecified organism: Secondary | ICD-10-CM

## 2018-08-17 MED ORDER — E-Z SPACER DEVI: 2 refills | Status: DC

## 2018-08-17 MED ORDER — HYDROCODONE-HOMATROPINE 5-1.5 MG/5ML PO SYRP: 5.0000 mL | ORAL_SOLUTION | Freq: Three times a day (TID) | ORAL | 0 refills | Status: DC | PRN

## 2018-08-17 MED ORDER — ALBUTEROL SULFATE HFA 108 (90 BASE) MCG/ACT IN AERS: 2.0000 | INHALATION_SPRAY | Freq: Four times a day (QID) | RESPIRATORY_TRACT | 0 refills | Status: DC | PRN

## 2018-08-17 MED ORDER — ALBUTEROL SULFATE (2.5 MG/3ML) 0.083% IN NEBU
2.5000 mg | INHALATION_SOLUTION | Freq: Once | RESPIRATORY_TRACT | Status: AC
  Administered 2018-08-17: 2.5 mg via RESPIRATORY_TRACT

## 2018-08-17 MED ORDER — DOXYCYCLINE HYCLATE 100 MG PO TABS: 100.0000 mg | ORAL_TABLET | Freq: Two times a day (BID) | ORAL | 0 refills | Status: AC

## 2018-08-17 NOTE — Progress Notes (Signed)
Michele Andersen DOB: 1975/09/24 Encounter date: 08/17/2018  This is a 42 y.o. female who presents with Chief Complaint  Patient presents with  . Cough    x 1 week, productive cough, clear mucus, drainage, sore throat, exposure to RSV,     History of present illness:  Started last Sunday with coughing. Progressed through last week and she felt awful. Felt better over weekend, but then cough started to feel worse a couple of days ago. Feels wet in lungs, feels like she is wheezing. Feels short of breath just with stairs. More winded than she normally would.     Allergies  Allergen Reactions  . Thimerosal Itching   Current Meds  Medication Sig  . cetirizine (ZYRTEC) 10 MG tablet Take 10 mg by mouth daily as needed. For allergies  . progesterone (PROMETRIUM) 100 MG capsule Take 50 mg by mouth daily.  Marland Kitchen triamcinolone cream (KENALOG) 0.1 % Apply 1 application topically 2 (two) times daily.    Review of Systems  Constitutional: Negative for chills and fever.  HENT: Positive for congestion and postnasal drip. Negative for sinus pressure, sinus pain and sore throat.   Respiratory: Positive for cough, shortness of breath and wheezing.   Cardiovascular: Positive for chest pain (with breathing).    Objective:  BP 120/80 (BP Location: Left Arm, Patient Position: Sitting, Cuff Size: Normal)   Pulse 85   Temp 98.7 F (37.1 C) (Oral)   Wt 169 lb 6.4 oz (76.8 kg)   SpO2 99%   BMI 30.01 kg/m   Weight: 169 lb 6.4 oz (76.8 kg)   BP Readings from Last 3 Encounters:  08/17/18 120/80  01/26/18 108/70  03/31/14 124/80   Wt Readings from Last 3 Encounters:  08/17/18 169 lb 6.4 oz (76.8 kg)  01/26/18 171 lb 9.6 oz (77.8 kg)  03/31/14 163 lb (73.9 kg)    Physical Exam Constitutional:      Appearance: She is well-developed. She is ill-appearing. She is not toxic-appearing.  HENT:     Right Ear: Tympanic membrane, ear canal and external ear normal.     Left Ear: Tympanic  membrane, ear canal and external ear normal.     Nose: Mucosal edema present.     Right Sinus: No maxillary sinus tenderness or frontal sinus tenderness.     Left Sinus: No maxillary sinus tenderness or frontal sinus tenderness.     Mouth/Throat:     Pharynx: Uvula midline.  Cardiovascular:     Rate and Rhythm: Normal rate and regular rhythm.     Heart sounds: Normal heart sounds.  Pulmonary:     Effort: Pulmonary effort is normal.     Breath sounds: Examination of the right-lower field reveals rales. Decreased breath sounds (improved after air exchange), wheezing (resolve with albuterol neb) and rales present. No rhonchi.     Assessment/Plan 1. Wheezing - albuterol (PROVENTIL) (2.5 MG/3ML) 0.083% nebulizer solution 2.5 mg - PR INHAL RX, AIRWAY OBST/DX SPUTUM INDUCT - albuterol (PROVENTIL HFA;VENTOLIN HFA) 108 (90 Base) MCG/ACT inhaler; Inhale 2 puffs into the lungs every 6 (six) hours as needed for wheezing or shortness of breath.  Dispense: 1 Inhaler; Refill: 0 - Spacer/Aero-Holding Chambers (E-Z SPACER) inhaler; Use as instructed  Dispense: 1 each; Refill: 2  2. Pneumonia of right lower lobe due to infectious organism ([place])  - doxycycline (VIBRA-TABS) 100 MG tablet; Take 1 tablet (100 mg total) by mouth 2 (two) times daily for 7 days.  Dispense: 14 tablet; Refill: 0 -  HYDROcodone-homatropine (HYCODAN) 5-1.5 MG/5ML syrup; Take 5 mLs by mouth every 8 (eight) hours as needed for cough.  Dispense: 120 mL; Refill: 0   Return if symptoms worsen or fail to improve.    Micheline Rough, MD

## 2018-08-17 NOTE — Patient Instructions (Signed)
How to Use a Metered Dose Inhaler A metered dose inhaler is a handheld device for taking medicine that must be breathed into the lungs (inhaled). The device can be used to deliver a variety of inhaled medicines, including:  Quick relief or rescue medicines, such as bronchodilators.  Controller medicines, such as corticosteroids. The medicine is delivered by pushing down on a metal canister to release a preset amount of spray and medicine. Each device contains the amount of medicine that is needed for a preset number of uses (inhalations). Your health care provider may recommend that you use a spacer with your inhaler to help you take the medicine more effectively. A spacer is a plastic tube with a mouthpiece on one end and an opening that connects to the inhaler on the other end. A spacer holds the medicine in a tube for a short time, which allows you to inhale more medicine. What are the risks? If you do not use your inhaler correctly, medicine might not reach your lungs to help you breathe. Inhaler medicine can cause side effects, such as:  Mouth or throat infection.  Cough.  Hoarseness.  Headache.  Nausea and vomiting.  Lung infection (pneumonia) in people who have a lung condition called COPD. How to use a metered dose inhaler without a spacer  1. Remove the cap from the inhaler. 2. If you are using the inhaler for the first time, shake it for 5 seconds, turn it away from your face, then release 4 puffs into the air. This is called priming. 3. Shake the inhaler for 5 seconds. 4. Position the inhaler so the top of the canister faces up. 5. Put your index finger on the top of the medicine canister. Support the bottom of the inhaler with your thumb. 6. Breathe out normally and as completely as possible, away from the inhaler. 7. Either place the inhaler between your teeth and close your lips tightly around the mouthpiece, or hold the inhaler 1-2 inches (2.5-5 cm) away from your open  mouth. Keep your tongue down out of the way. If you are unsure which technique to use, ask your health care provider. 8. Press the canister down with your index finger to release the medicine, then inhale deeply and slowly through your mouth (not your nose) until your lungs are completely filled. Inhaling should take 4-6 seconds. 9. Hold the medicine in your lungs for 5-10 seconds (10 seconds is best). This helps the medicine get into the small airways of your lungs. 10. With your lips in a tight circle (pursed), breathe out slowly. 11. Repeat steps 3-10 until you have taken the number of puffs that your health care provider directed. Wait about 1 minute between puffs or as directed. 12. Put the cap on the inhaler. 13. If you are using a steroid inhaler, rinse your mouth with water, gargle, and spit out the water. Do not swallow the water. How to use a metered dose inhaler with a spacer  1. Remove the cap from the inhaler. 2. If you are using the inhaler for the first time, shake it for 5 seconds, turn it away from your face, then release 4 puffs into the air. This is called priming. 3. Shake the inhaler for 5 seconds. 4. Place the open end of the spacer onto the inhaler mouthpiece. 5. Position the inhaler so the top of the canister faces up and the spacer mouthpiece faces you. 6. Put your index finger on the top of the medicine canister.   Support the bottom of the inhaler and the spacer with your thumb. 7. Breathe out normally and as completely as possible, away from the spacer. 8. Place the spacer between your teeth and close your lips tightly around it. Keep your tongue down out of the way. 9. Press the canister down with your index finger to release the medicine, then inhale deeply and slowly through your mouth (not your nose) until your lungs are completely filled. Inhaling should take 4-6 seconds. 10. Hold the medicine in your lungs for 5-10 seconds (10 seconds is best). This helps the  medicine get into the small airways of your lungs. 11. With your lips in a tight circle (pursed), breathe out slowly. 12. Repeat steps 3-11 until you have taken the number of puffs that your health care provider directed. Wait about 1 minute between puffs or as directed. 13. Remove the spacer from the inhaler and put the cap on the inhaler. 14. If you are using a steroid inhaler, rinse your mouth with water, gargle, and spit out the water. Do not swallow the water. Follow these instructions at home:  Take your inhaled medicine only as told by your health care provider. Do not use the inhaler more than directed by your health care provider.  Keep all follow-up visits as told by your health care provider. This is important.  If your inhaler has a counter, you can check it to determine how full your inhaler is. If your inhaler does not have a counter, ask your health care provider when you will need to refill your inhaler and write the refill date on a calendar or on your inhaler canister. Note that you cannot know when an inhaler is empty by shaking it.  Follow directions on the package insert for care and cleaning of your inhaler and spacer. Contact a health care provider if:  Symptoms are only partially relieved with your inhaler.  You are having trouble using your inhaler.  You have an increase in phlegm.  You have headaches. Get help right away if:  You feel little or no relief after using your inhaler.  You have dizziness.  You have a fast heart rate.  You have chills or a fever.  You have night sweats.  There is blood in your phlegm. Summary  A metered dose inhaler is a handheld device for taking medicine that must be breathed into the lungs (inhaled).  The medicine is delivered by pushing down on a metal canister to release a preset amount of spray and medicine.  Each device contains the amount of medicine that is needed for a preset number of uses (inhalations). This  information is not intended to replace advice given to you by your health care provider. Make sure you discuss any questions you have with your health care provider. Document Released: 08/08/2005 Document Revised: 02/27/2017 Document Reviewed: 06/28/2016 Elsevier Interactive Patient Education  2019 [employer].  How to Use a Metered Dose Inhaler A metered dose inhaler is a handheld device for taking medicine that must be breathed into the lungs (inhaled). The device can be used to deliver a variety of inhaled medicines, including:  Quick relief or rescue medicines, such as bronchodilators.  Controller medicines, such as corticosteroids. The medicine is delivered by pushing down on a metal canister to release a preset amount of spray and medicine. Each device contains the amount of medicine that is needed for a preset number of uses (inhalations). Your health care provider may recommend that you  use a spacer with your inhaler to help you take the medicine more effectively. A spacer is a plastic tube with a mouthpiece on one end and an opening that connects to the inhaler on the other end. A spacer holds the medicine in a tube for a short time, which allows you to inhale more medicine. What are the risks? If you do not use your inhaler correctly, medicine might not reach your lungs to help you breathe. Inhaler medicine can cause side effects, such as:  Mouth or throat infection.  Cough.  Hoarseness.  Headache.  Nausea and vomiting.  Lung infection (pneumonia) in people who have a lung condition called COPD. How to use a metered dose inhaler without a spacer  14. Remove the cap from the inhaler. 15. If you are using the inhaler for the first time, shake it for 5 seconds, turn it away from your face, then release 4 puffs into the air. This is called priming. 16. Shake the inhaler for 5 seconds. 17. Position the inhaler so the top of the canister faces up. 18. Put your index finger on  the top of the medicine canister. Support the bottom of the inhaler with your thumb. 19. Breathe out normally and as completely as possible, away from the inhaler. 20. Either place the inhaler between your teeth and close your lips tightly around the mouthpiece, or hold the inhaler 1-2 inches (2.5-5 cm) away from your open mouth. Keep your tongue down out of the way. If you are unsure which technique to use, ask your health care provider. 21. Press the canister down with your index finger to release the medicine, then inhale deeply and slowly through your mouth (not your nose) until your lungs are completely filled. Inhaling should take 4-6 seconds. 22. Hold the medicine in your lungs for 5-10 seconds (10 seconds is best). This helps the medicine get into the small airways of your lungs. 23. With your lips in a tight circle (pursed), breathe out slowly. 24. Repeat steps 3-10 until you have taken the number of puffs that your health care provider directed. Wait about 1 minute between puffs or as directed. 25. Put the cap on the inhaler. 26. If you are using a steroid inhaler, rinse your mouth with water, gargle, and spit out the water. Do not swallow the water. How to use a metered dose inhaler with a spacer  15. Remove the cap from the inhaler. 16. If you are using the inhaler for the first time, shake it for 5 seconds, turn it away from your face, then release 4 puffs into the air. This is called priming. 17. Shake the inhaler for 5 seconds. 18. Place the open end of the spacer onto the inhaler mouthpiece. 19. Position the inhaler so the top of the canister faces up and the spacer mouthpiece faces you. 20. Put your index finger on the top of the medicine canister. Support the bottom of the inhaler and the spacer with your thumb. 21. Breathe out normally and as completely as possible, away from the spacer. 22. Place the spacer between your teeth and close your lips tightly around it. Keep your  tongue down out of the way. 23. Press the canister down with your index finger to release the medicine, then inhale deeply and slowly through your mouth (not your nose) until your lungs are completely filled. Inhaling should take 4-6 seconds. 24. Hold the medicine in your lungs for 5-10 seconds (10 seconds is best). This helps  the medicine get into the small airways of your lungs. 25. With your lips in a tight circle (pursed), breathe out slowly. 26. Repeat steps 3-11 until you have taken the number of puffs that your health care provider directed. Wait about 1 minute between puffs or as directed. 27. Remove the spacer from the inhaler and put the cap on the inhaler. 28. If you are using a steroid inhaler, rinse your mouth with water, gargle, and spit out the water. Do not swallow the water. Follow these instructions at home:  Take your inhaled medicine only as told by your health care provider. Do not use the inhaler more than directed by your health care provider.  Keep all follow-up visits as told by your health care provider. This is important.  If your inhaler has a counter, you can check it to determine how full your inhaler is. If your inhaler does not have a counter, ask your health care provider when you will need to refill your inhaler and write the refill date on a calendar or on your inhaler canister. Note that you cannot know when an inhaler is empty by shaking it.  Follow directions on the package insert for care and cleaning of your inhaler and spacer. Contact a health care provider if:  Symptoms are only partially relieved with your inhaler.  You are having trouble using your inhaler.  You have an increase in phlegm.  You have headaches. Get help right away if:  You feel little or no relief after using your inhaler.  You have dizziness.  You have a fast heart rate.  You have chills or a fever.  You have night sweats.  There is blood in your phlegm. Summary  A  metered dose inhaler is a handheld device for taking medicine that must be breathed into the lungs (inhaled).  The medicine is delivered by pushing down on a metal canister to release a preset amount of spray and medicine.  Each device contains the amount of medicine that is needed for a preset number of uses (inhalations). This information is not intended to replace advice given to you by your health care provider. Make sure you discuss any questions you have with your health care provider. Document Released: 08/08/2005 Document Revised: 02/27/2017 Document Reviewed: 06/28/2016 Elsevier Interactive Patient Education  2019 [employer].

## 2018-09-07 ENCOUNTER — Encounter: Payer: Self-pay | Admitting: Internal Medicine

## 2018-09-07 ENCOUNTER — Ambulatory Visit (INDEPENDENT_AMBULATORY_CARE_PROVIDER_SITE_OTHER): Payer: 59

## 2018-09-07 ENCOUNTER — Ambulatory Visit (INDEPENDENT_AMBULATORY_CARE_PROVIDER_SITE_OTHER): Payer: 59 | Admitting: Internal Medicine

## 2018-09-07 VITALS — BP 120/76 | HR 81 | Temp 98.2°F | Wt 168.4 lb

## 2018-09-07 DIAGNOSIS — R05 Cough: Secondary | ICD-10-CM

## 2018-09-07 DIAGNOSIS — R053 Chronic cough: Secondary | ICD-10-CM

## 2018-09-07 DIAGNOSIS — R079 Chest pain, unspecified: Secondary | ICD-10-CM

## 2018-09-07 DIAGNOSIS — R062 Wheezing: Secondary | ICD-10-CM | POA: Diagnosis not present

## 2018-09-07 MED ORDER — PREDNISONE 20 MG PO TABS: ORAL_TABLET | ORAL | 0 refills | Status: DC

## 2018-09-07 MED ORDER — ALBUTEROL SULFATE HFA 108 (90 BASE) MCG/ACT IN AERS: 2.0000 | INHALATION_SPRAY | Freq: Four times a day (QID) | RESPIRATORY_TRACT | 0 refills | Status: DC | PRN

## 2018-09-07 NOTE — Progress Notes (Signed)
Chief Complaint  Patient presents with  . Cough    since december pt is still having a mostly dry cough and is  occasionally coughing stuff up that is clear to slight yellow.but is now having rib pain on her right side     HPI: Michele Andersen 43 y.o. come in for  SDA  NEW PROBLEM  Last seem bymed in 2014 but seen  Fir wheezing in clinic  In December 2019  rx with doxy and   albuerol  Dx pna   By clinical exam    Lungs feeling wet doing better    ocass coughing up now dry and still feels in lungs   Although   Sinus are better  Albuterol helps some   Lingering and then upper  r rib cage   No fever . Worse with moveing  Cough and breath. ocass short  Of breath and tightness.  And spasm.   Coworker child had rsv and she had sx  ROS: See pertinent positives and negatives per HPI.  Past Medical History:  Diagnosis Date  . Allergy   . Depression   . Eczema     Family History  Problem Relation Age of Onset  . Hyperlipidemia Mother   . Diabetes Father   . Hyperlipidemia Father     Social History   Socioeconomic History  . Marital status: Married    Spouse name: Not on file  . Number of children: Not on file  . Years of education: Not on file  . Highest education level: Not on file  Occupational History  . Not on file  Social Needs  . Financial resource strain: Not on file  . Food insecurity:    Worry: Not on file    Inability: Not on file  . Transportation needs:    Medical: Not on file    Non-medical: Not on file  Tobacco Use  . Smoking status: Former Research scientist (life sciences)  . Smokeless tobacco: Never Used  Substance and Sexual Activity  . Alcohol use: Yes  . Drug use: No  . Sexual activity: Not on file  Lifestyle  . Physical activity:    Days per week: Not on file    Minutes per session: Not on file  . Stress: Not on file  Relationships  . Social connections:    Talks on phone: Not on file    Gets together: Not on file    Attends religious service: Not on file   Active member of club or organization: Not on file    Attends meetings of clubs or organizations: Not on file    Relationship status: Not on file  Other Topics Concern  . Not on file  Social History Narrative  . Not on file    Outpatient Medications Prior to Visit  Medication Sig Dispense Refill  . cetirizine (ZYRTEC) 10 MG tablet Take 10 mg by mouth daily as needed. For allergies    . HYDROcodone-homatropine (HYCODAN) 5-1.5 MG/5ML syrup Take 5 mLs by mouth every 8 (eight) hours as needed for cough. 120 mL 0  . progesterone (PROMETRIUM) 100 MG capsule Take 50 mg by mouth daily.    Marland Kitchen Spacer/Aero-Holding Chambers (E-Z SPACER) inhaler Use as instructed 1 each 2  . triamcinolone cream (KENALOG) 0.1 % Apply 1 application topically 2 (two) times daily. 30 g 0  . albuterol (PROVENTIL HFA;VENTOLIN HFA) 108 (90 Base) MCG/ACT inhaler Inhale 2 puffs into the lungs every 6 (six) hours as needed for wheezing  or shortness of breath. 1 Inhaler 0   No facility-administered medications prior to visit.      EXAM:  BP 120/76 (BP Location: Right Arm, Patient Position: Sitting, Cuff Size: Normal)   Pulse 81   Temp 98.2 F (36.8 C) (Oral)   Wt 168 lb 6.4 oz (76.4 kg)   SpO2 99%   BMI 29.83 kg/m   Body mass index is 29.83 kg/m.  GENERAL: vitals reviewed and listed above, alert, oriented, appears well hydrated and in no acute distress HEENT: atraumatic, conjunctiva  clear, no obvious abnormalities on inspection of external nose and ears tm clear OP : no lesion edema or exudate  NECK: no obvious masses on inspection palpation  LUNGS: clear to auscultation bilaterally, no wheezes, rales or rhonchi, good air movement righ ant rib are tender no sqeeze test no rubs    CV: HRRR, no clubbing cyanosis or  peripheral edema nl cap refill  Abdomen:  Sof,t normal bowel sounds without hepatosplenomegaly, no guarding rebound or masses no CVA tenderness MS: moves all extremities without noticeable focal   abnormality PSYCH: pleasant and cooperative, no obvious depression or anxiety Lab Results  Component Value Date   WBC 11.5 (H) 09/15/2012   HGB 13.6 09/15/2012   HCT 39.3 09/15/2012   PLT 336 09/15/2012   GLUCOSE 92 09/15/2012   CHOL 169 08/06/2010   TRIG 124.0 08/06/2010   HDL 34.10 (L) 08/06/2010   LDLCALC 110 (H) 08/06/2010   ALT 10 08/06/2010   AST 11 08/06/2010   NA 136 09/15/2012   K 3.9 09/15/2012   CL 102 09/15/2012   CREATININE 0.54 09/15/2012   BUN 7 09/15/2012   CO2 24 09/15/2012   TSH 3.16 08/06/2010   BP Readings from Last 3 Encounters:  09/07/18 120/76  08/17/18 120/80  01/26/18 108/70  x ray reassuring    noad lordotic view   ASSESSMENT AND PLAN:  Discussed the following assessment and plan:  Cough, persistent - Plan: DG Chest 2 View, DG Chest Lordotic Only  Right-sided chest pain - Plan: DG Chest 2 View, DG Chest Lordotic Only  Wheezing - Plan: albuterol (PROVENTIL HFA;VENTOLIN HFA) 108 (90 Base) MCG/ACT inhaler Suspect post infectious  Cough and bronchial irritation    Act like rsv  In babies but no alarm  On exam)   Suspect right pain is cp from coughing  Follow  -Patient advised to return or notify health care team  if  new concerns arise.  Patient Instructions  X ray is   Ok   Suspect post infectious ccause of cough   Trail of  prednisone in addition  Of the albuterol   To decrease inflammation in bronchial tubes  Stay hydrated   Let us know if not improving  In 2 weeks or if worse .   Standley Brooking.  M.D.

## 2018-09-07 NOTE — Patient Instructions (Addendum)
X ray is   Ok   Suspect post infectious ccause of cough   Trail of  prednisone in addition  Of the albuterol   To decrease inflammation in bronchial tubes  Stay hydrated   Let us know if not improving  In 2 weeks or if worse .

## 2018-12-10 ENCOUNTER — Encounter: Payer: Self-pay | Admitting: Physician Assistant

## 2018-12-10 ENCOUNTER — Telehealth: Payer: 59 | Admitting: Physician Assistant

## 2018-12-10 DIAGNOSIS — R059 Cough, unspecified: Secondary | ICD-10-CM

## 2018-12-10 DIAGNOSIS — R05 Cough: Secondary | ICD-10-CM

## 2018-12-10 DIAGNOSIS — R0600 Dyspnea, unspecified: Secondary | ICD-10-CM

## 2018-12-10 NOTE — Progress Notes (Signed)
Based on what you shared with me, I feel your condition warrants further evaluation and I recommend that you be seen for a face to face office visit.   As per our conversation,  You have stated that the shortness of breath has been since the diagnosis of pneumonia in January, and has not completely resolved, despite our frequent use do albuterol inhaler. Due to this, it is recommended that you have a face to face evaluation for further workup of your symptoms.     NOTE: If you entered your credit card information for this eVisit, you will not be charged. You may see a "hold" on your card for the $35 but that hold will drop off and you will not have a charge processed.  If you are having a true medical emergency please call 911.  If you need an urgent face to face visit, [place] has four urgent care centers for your convenience.    PLEASE NOTE: THE [facility] LOCATIONS AND URGENT CARE CLINICS DO NOT HAVE THE TESTING FOR CORONAVIRUS COVID19 AVAILABLE.  IF YOU FEEL YOU NEED THIS TEST YOU MUST GO TO A TRIAGE LOCATION AT [place]   [email or web address] to reserve your spot online an avoid wait times  [facility] [address] [place], [place] [address] Modified hours of operation: Monday-Friday, 10 AM to 6 PM  Saturday & Sunday 10 AM to 4 PM *Across the street from [place] (New Address!) [address], [place], [place] [address] *Just off [address], across the road from [place] hours of operation: Monday-Friday, 10 AM to 5 PM  Closed Saturday & Sunday   The following sites will take your insurance:  . [facility] [place] a Provider at this Location  [address] [place], [place] [address] . 10 am to 8 pm Monday-Friday . 12 pm to 8 pm Saturday-Sunday   . [facility] at [place] a Provider at this Location  [place], [place], [place] [address] . 8 am to 8 pm Monday-Friday . 9 am to 6 pm Saturday . 11 am to 6 pm Sunday   . [facility] at [place] Get Driving Directions  [address] [place],  [address] . 8 am to 8 pm Monday-Friday . 8 am to 4 pm Saturday-Sunday   Your e-visit answers were reviewed by a board certified advanced clinical practitioner to complete your personal care plan.  Thank you for using e-Visits. I have spent 7 min in completion and review of this note- Lacy Duverney [facility]

## 2019-01-23 LAB — HM MAMMOGRAPHY

## 2019-03-11 ENCOUNTER — Encounter: Payer: Self-pay | Admitting: Internal Medicine

## 2019-05-08 ENCOUNTER — Encounter: Payer: Self-pay | Admitting: Family Medicine

## 2019-05-08 ENCOUNTER — Ambulatory Visit (INDEPENDENT_AMBULATORY_CARE_PROVIDER_SITE_OTHER): Payer: 59 | Admitting: Family Medicine

## 2019-05-08 ENCOUNTER — Other Ambulatory Visit: Payer: Self-pay

## 2019-05-08 ENCOUNTER — Ambulatory Visit: Payer: 59 | Admitting: Family Medicine

## 2019-05-08 VITALS — BP 122/80 | HR 69 | Temp 98.2°F | Ht 63.0 in | Wt 153.4 lb

## 2019-05-08 DIAGNOSIS — R002 Palpitations: Secondary | ICD-10-CM | POA: Diagnosis not present

## 2019-05-08 NOTE — Patient Instructions (Signed)
Health Maintenance Due  Topic Date Due  . HIV Screening  07/08/1989  . PAP SMEAR-Modifier  08/22/2012  . INFLUENZA VACCINE  03/23/2019    No flowsheet data found.

## 2019-05-08 NOTE — Progress Notes (Signed)
   Subjective:    Patient ID: Michele Andersen, female    DOB: [birth year], 43 y.o.   MRN: [id number]  HPI Here for occasional palpitations in the chest that started a few weeks ago. She notices them more at rest, never during exercise. She feels a "blip" or a skip and then it stops. No SOB or chest pain. She has been exercising lately and watching her diet, and she has lost 20 lbs. She does not takes supplements and her only caffeine intake is 2 cups of coffee each morning. She does admit to some extra stress in her life lately.    Review of Systems  Constitutional: Negative.   Respiratory: Negative.   Cardiovascular: Positive for palpitations. Negative for chest pain and leg swelling.  Gastrointestinal: Negative.   Neurological: Negative.        Objective:   Physical Exam Constitutional:      General: She is not in acute distress.    Appearance: Normal appearance.  Cardiovascular:     Rate and Rhythm: Normal rate and regular rhythm.     Pulses: Normal pulses.     Heart sounds: Normal heart sounds.     Comments: EKG is normal  Pulmonary:     Effort: Pulmonary effort is normal.     Breath sounds: Normal breath sounds.  Lymphadenopathy:     Cervical: No cervical adenopathy.  Neurological:     General: No focal deficit present.     Mental Status: She is alert and oriented to person, place, and time.  Psychiatric:        Mood and Affect: Mood normal.           Assessment & Plan:  Palpitations, likely some PACs or PVCs. She will keep an eye on these and recheck if they become more frequent or more symptomatic.  Alysia Penna, MD

## 2019-09-13 ENCOUNTER — Encounter: Payer: Self-pay | Admitting: Family Medicine

## 2019-09-13 ENCOUNTER — Ambulatory Visit (INDEPENDENT_AMBULATORY_CARE_PROVIDER_SITE_OTHER): Payer: BC Managed Care – PPO | Admitting: Family Medicine

## 2019-09-13 ENCOUNTER — Other Ambulatory Visit: Payer: Self-pay

## 2019-09-13 VITALS — BP 118/86 | HR 72 | Temp 97.7°F | Wt 134.5 lb

## 2019-09-13 DIAGNOSIS — R002 Palpitations: Secondary | ICD-10-CM | POA: Diagnosis not present

## 2019-09-13 DIAGNOSIS — R42 Dizziness and giddiness: Secondary | ICD-10-CM

## 2019-09-13 LAB — BASIC METABOLIC PANEL
BUN: 13 mg/dL (ref 6–23)
CO2: 27 mEq/L (ref 19–32)
Calcium: 9.2 mg/dL (ref 8.4–10.5)
Chloride: 106 mEq/L (ref 96–112)
Creatinine, Ser: 0.64 mg/dL (ref 0.40–1.20)
GFR: 100.27 mL/min (ref >60.00)
Glucose, Bld: 90 mg/dL (ref 70–99)
Potassium: 4.2 mEq/L (ref 3.5–5.1)
Sodium: 141 mEq/L (ref 135–145)

## 2019-09-13 LAB — CBC WITH DIFFERENTIAL/PLATELET
Basophils Absolute: 0 10*3/uL (ref 0.0–0.1)
Basophils Relative: 0.7 % (ref 0.0–3.0)
Eosinophils Absolute: 0.1 10*3/uL (ref 0.0–0.7)
Eosinophils Relative: 1.9 % (ref 0.0–5.0)
HCT: 40.2 % (ref 36.0–46.0)
Hemoglobin: 13.8 g/dL (ref 12.0–15.0)
Lymphocytes Relative: 32.5 % (ref 12.0–46.0)
Lymphs Abs: 1.9 10*3/uL (ref 0.7–4.0)
MCHC: 34.3 g/dL (ref 30.0–36.0)
MCV: 87.3 fl (ref 78.0–100.0)
Monocytes Absolute: 0.4 10*3/uL (ref 0.1–1.0)
Monocytes Relative: 7.6 % (ref 3.0–12.0)
Neutro Abs: 3.3 10*3/uL (ref 1.4–7.7)
Neutrophils Relative %: 57.3 % (ref 43.0–77.0)
Platelets: 308 10*3/uL (ref 150.0–400.0)
RBC: 4.61 Mil/uL (ref 3.87–5.11)
RDW: 13.1 % (ref 11.5–15.5)
WBC: 5.8 10*3/uL (ref 4.0–10.5)

## 2019-09-13 LAB — MAGNESIUM: Magnesium: 1.9 mg/dL (ref 1.5–2.5)

## 2019-09-13 LAB — TSH: TSH: 1.46 u[IU]/mL (ref 0.35–4.50)

## 2019-09-13 NOTE — Progress Notes (Signed)
Subjective:     Patient ID: Michele Andersen, female   DOB: 1976-01-01, 44 y.o.   MRN: [id number]  HPI   Michele Andersen is seen with chief complaint of intermittent palpitations and lightheadedness really for several months.  She states she has long history of irregular and sometimes heavy menses.  She sees gynecologist and takes Provera currently.  She is not had any recent labs.  She has been having intermittent palpitations for months.  She states that this feels like a "flutter" sensation that lasts usually just seconds.  She feels like her heart rate may be slightly faster during episodes and this is very transient.  She walks for exercise and has never had any exertional symptoms.  No chest pain.  No syncope.  She has intermittent lightheadedness which is not always positional.  She states she is lost 40 pounds this year due to her efforts at weight loss.  She has made some major dietary changes and walking regularly as above.  Feels good overall.  Strong family history of type 2 diabetes.  No polyuria or polydipsia.  Minimal caffeine use.  No regular alcohol use.  No decongestant use.  She had EKG back in September this was reviewed and showed sinus rhythm with no acute changes.  Past Medical History:  Diagnosis Date  . Allergy   . Depression   . Eczema    History reviewed. No pertinent surgical history.  reports that she has quit smoking. She has never used smokeless tobacco. She reports current alcohol use. She reports that she does not use drugs. family history includes Diabetes in her father; Hyperlipidemia in her father and mother. Allergies  Allergen Reactions  . Thimerosal Itching     Review of Systems  Constitutional: Negative for appetite change, chills, fatigue, fever and unexpected weight change.  HENT: Negative for sore throat.   Respiratory: Negative for cough and shortness of breath.   Cardiovascular: Positive for palpitations. Negative for chest pain and leg swelling.   Gastrointestinal: Negative for abdominal pain.  Genitourinary: Negative for dysuria.  Neurological: Positive for light-headedness. Negative for syncope and weakness.  Hematological: Negative for adenopathy. Does not bruise/bleed easily.       Objective:   Physical Exam Vitals reviewed.  Constitutional:      Appearance: Normal appearance.  Cardiovascular:     Rate and Rhythm: Normal rate and regular rhythm.     Heart sounds: No murmur. No gallop.   Pulmonary:     Effort: Pulmonary effort is normal.     Breath sounds: Normal breath sounds.  Musculoskeletal:     Cervical back: Neck supple.     Right lower leg: No edema.     Left lower leg: No edema.  Lymphadenopathy:     Cervical: No cervical adenopathy.        Assessment:     #1 several month history of intermittent palpitations.  These are very transient and likely represent PACs or PVCs.  She does not have any red flags such as syncope or chest pain or any current abnormal physical findings on exam  #2 intermittent lightheadedness.  Rule out anemia    Plan:     -Check labs including basic metabolic panel, magnesium level, CBC, TSH -We decided not to get EKG since she has normal exam at this time and had EKG back in September -Set up cardiac event monitor to see if we can capture her heart irregularity -Continue to minimize caffeine use. -We discussed possibility of low-dose  beta-blocker use to blunt things like PVCs and PACs but would like to get event monitor first  Eulas Post MD [facility]

## 2019-09-13 NOTE — Patient Instructions (Signed)
Palpitations Palpitations are feelings that your heartbeat is irregular or is faster than normal. It may feel like your heart is fluttering or skipping a beat. Palpitations are usually not a serious problem. They may be caused by many things, including smoking, caffeine, alcohol, stress, and certain medicines or drugs. Most causes of palpitations are not serious. However, some palpitations can be a sign of a serious problem. You may need further tests to rule out serious medical problems. Follow these instructions at home:     Pay attention to any changes in your condition. Take these actions to help manage your symptoms: Eating and drinking  Avoid foods and drinks that may cause palpitations. These may include: ? Caffeinated coffee, tea, soft drinks, diet pills, and energy drinks. ? Chocolate. ? Alcohol. Lifestyle  Take steps to reduce your stress and anxiety. Things that can help you relax include: ? Yoga. ? Mind-body activities, such as deep breathing, meditation, or using words and images to create positive thoughts (guided imagery). ? Physical activity, such as swimming, jogging, or walking. Tell your health care provider if your palpitations increase with activity. If you have chest pain or shortness of breath with activity, do not continue the activity until you are seen by your health care provider. ? Biofeedback. This is a method that helps you learn to use your mind to control things in your body, such as your heartbeat.  Do not use drugs, including cocaine or ecstasy. Do not use marijuana.  Get plenty of rest and sleep. Keep a regular bed time. General instructions  Take over-the-counter and prescription medicines only as told by your health care provider.  Do not use any products that contain nicotine or tobacco, such as cigarettes and e-cigarettes. If you need help quitting, ask your health care provider.  Keep all follow-up visits as told by your health care provider. This  is important. These may include visits for further testing if palpitations do not go away or get worse. Contact a health care provider if you:  Continue to have a fast or irregular heartbeat after 24 hours.  Notice that your palpitations occur more often. Get help right away if you:  Have chest pain or shortness of breath.  Have a severe headache.  Feel dizzy or you faint. Summary  Palpitations are feelings that your heartbeat is irregular or is faster than normal. It may feel like your heart is fluttering or skipping a beat.  Palpitations may be caused by many things, including smoking, caffeine, alcohol, stress, certain medicines, and drugs.  Although most causes of palpitations are not serious, some causes can be a sign of a serious medical problem.  Get help right away if you faint or have chest pain, shortness of breath, a severe headache, or dizziness. This information is not intended to replace advice given to you by your health care provider. Make sure you discuss any questions you have with your health care provider. Document Revised: 09/20/2017 Document Reviewed: 09/20/2017 Elsevier Patient Education  [place] TO FURTHER ASSESS

## 2019-09-16 ENCOUNTER — Telehealth: Payer: Self-pay | Admitting: *Deleted

## 2019-09-16 NOTE — Telephone Encounter (Signed)
Patient enrolled for [employer] to ship a 7 day cardiac event monitor to her home.  Instructions included in the monitor kit.

## 2019-09-19 ENCOUNTER — Ambulatory Visit (INDEPENDENT_AMBULATORY_CARE_PROVIDER_SITE_OTHER): Payer: BC Managed Care – PPO

## 2019-09-19 DIAGNOSIS — R002 Palpitations: Secondary | ICD-10-CM

## 2019-12-13 HISTORY — PX: WRIST GANGLION EXCISION: SUR520

## 2019-12-21 ENCOUNTER — Ambulatory Visit: Payer: BC Managed Care – PPO | Attending: Internal Medicine

## 2019-12-21 DIAGNOSIS — Z23 Encounter for immunization: Secondary | ICD-10-CM

## 2019-12-21 NOTE — Progress Notes (Signed)
   Covid-19 Vaccination Clinic  Name:  Michele Andersen    MRN: [id number] DOB: [birth year]  12/21/2019  [name] was observed post Covid-19 immunization for 15 minutes without incident. She was provided with Vaccine Information Sheet and instruction to access the V-Safe system.   [name] was instructed to call 911 with any severe reactions post vaccine: Marland Kitchen Difficulty breathing  . Swelling of face and throat  . A fast heartbeat  . A bad rash all over body  . Dizziness and weakness   Immunizations Administered    Name Date Dose VIS Date Route   Pfizer COVID-19 Vaccine 12/21/2019 11:09 AM 0.3 mL 10/16/2018 Intramuscular   Manufacturer: [place]   Lot: [id number]   [place]: [id number]

## 2020-01-13 ENCOUNTER — Ambulatory Visit: Payer: BC Managed Care – PPO

## 2020-01-21 ENCOUNTER — Ambulatory Visit: Payer: BC Managed Care – PPO | Attending: Internal Medicine

## 2020-01-21 DIAGNOSIS — Z23 Encounter for immunization: Secondary | ICD-10-CM

## 2020-01-21 NOTE — Progress Notes (Signed)
   Covid-19 Vaccination Clinic  Name:  Michele Andersen    MRN: [id number] DOB: 21-Apr-1976  01/21/2020  Michele Andersen was observed post Covid-19 immunization for 15 minutes without incident. She was provided with Vaccine Information Sheet and instruction to access the V-Safe system.   Michele Andersen was instructed to call 911 with any severe reactions post vaccine: Marland Kitchen Difficulty breathing  . Swelling of face and throat  . A fast heartbeat  . A bad rash all over body  . Dizziness and weakness   Immunizations Administered    Name Date Dose VIS Date Route   Pfizer COVID-19 Vaccine 01/21/2020 10:10 AM 0.3 mL 10/16/2018 Intramuscular   Manufacturer: [employer], [employer]   Lot: [id number]   [place]: [id number]

## 2020-04-07 LAB — HM MAMMOGRAPHY

## 2020-04-07 LAB — HM PAP SMEAR: HM Pap smear: NORMAL

## 2020-05-05 ENCOUNTER — Other Ambulatory Visit: Payer: Self-pay | Admitting: Obstetrics & Gynecology

## 2020-05-19 ENCOUNTER — Other Ambulatory Visit (HOSPITAL_COMMUNITY)
Admission: RE | Admit: 2020-05-19 | Discharge: 2020-05-19 | Disposition: A | Discharge status: Home / Self Care | Payer: BC Managed Care – PPO | Source: Ambulatory Visit | Attending: Obstetrics & Gynecology | Admitting: Obstetrics & Gynecology

## 2020-05-19 ENCOUNTER — Encounter (HOSPITAL_BASED_OUTPATIENT_CLINIC_OR_DEPARTMENT_OTHER): Payer: Self-pay | Admitting: Obstetrics & Gynecology

## 2020-05-19 ENCOUNTER — Other Ambulatory Visit: Payer: Self-pay

## 2020-05-19 DIAGNOSIS — Z01812 Encounter for preprocedural laboratory examination: Secondary | ICD-10-CM | POA: Diagnosis not present

## 2020-05-19 DIAGNOSIS — Z20822 Contact with and (suspected) exposure to covid-19: Secondary | ICD-10-CM | POA: Diagnosis not present

## 2020-05-19 LAB — SARS CORONAVIRUS 2 (TAT 6-24 HRS): SARS Coronavirus 2: NEGATIVE

## 2020-05-19 NOTE — Progress Notes (Signed)
Spoke w/ via phone for pre-op interview---pt Lab needs dos---- cbc urine preg              Lab results------none COVID test ------05-19-2020 Arrive at -------715 am 05-22-2020 NPO after MN NO Solid Food.  Clear liquids from MN until---615 am then npo Medications to take morning of surgery -----none Diabetic medication -----n/a Patient Special Instructions -----none Pre-Op special Istructions -----none Patient verbalized understanding of instructions that were given at this phone interview. Patient denies shortness of breath, chest pain, fever, cough at this phone interview.

## 2020-05-21 NOTE — H&P (Addendum)
Michele Andersen is an 44 y.o. female. Married, G0, here for fibroid resection. 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  Nl Paps, no breast issues, nl mammograms Healthy, no meds.   Patient's last menstrual period was 05/03/2020.    Past Medical History:  Diagnosis Date  . Allergy   . Anemia   . Anxiety   . Depression   . Eczema   . PONV (postoperative nausea and vomiting)   . Submucous myoma of uterus     Past Surgical History:  Procedure Laterality Date  . right arm surgery     right arm x 1 2017 right wrist x 1 11-2019 [facility] [place]    Family History  Problem Relation Age of Onset  . Hyperlipidemia Mother   . Diabetes Father   . Hyperlipidemia Father     Social History:  reports that she quit smoking about 17 years ago. Her smoking use included cigarettes. She has a 2.00 pack-year smoking history. She has never used smokeless tobacco. She reports current alcohol use. She reports that she does not use drugs.  Allergies:  Allergies  Allergen Reactions  . Thimerosal Itching    No medications prior to admission.    Review of Systems  Neg   Height 5\' 3"  (1.6 m), weight 58.1 kg, last menstrual period 05/03/2020. Physical Exam A&O x 3, no acute distress. Pleasant HEENT neg, no thyromegaly Lungs CTA bilat CV RRR, S1S2 normal Abdo soft, non tender, non acute Extr no edema/ tenderness Pelvic Uterus 6 wks, AV, normal cervix, nl adnexa   Assessment/Plan: 44 yo female with single submucosal myoma with menorrhagia, dysmenorrhea, here for Myosure hysteroscopic myoma resection.  Risks/complications of surgery reviewed incl infection, bleeding, damage to internal organs including bladder, bowels, ureters, blood vessels, other risks from anesthesia, VTE and delayed complications of any surgery, complications in future surgery reviewed.    Michele Andersen 05/21/2020, 8:28 PM   UPDATE No  change. Reviewed note and procedure with patient --V.Benjie Karvonen, MD

## 2020-05-22 ENCOUNTER — Encounter (HOSPITAL_BASED_OUTPATIENT_CLINIC_OR_DEPARTMENT_OTHER)
Admission: RE | Disposition: A | Discharge status: Home / Self Care | Payer: Self-pay | Source: Home / Self Care | Attending: Obstetrics & Gynecology

## 2020-05-22 ENCOUNTER — Ambulatory Visit (HOSPITAL_BASED_OUTPATIENT_CLINIC_OR_DEPARTMENT_OTHER)
Admission: RE | Admit: 2020-05-22 | Discharge: 2020-05-22 | Disposition: A | Discharge status: Home / Self Care | Payer: BC Managed Care – PPO | Attending: Obstetrics & Gynecology | Admitting: Obstetrics & Gynecology

## 2020-05-22 ENCOUNTER — Encounter (HOSPITAL_BASED_OUTPATIENT_CLINIC_OR_DEPARTMENT_OTHER): Payer: Self-pay | Admitting: Obstetrics & Gynecology

## 2020-05-22 ENCOUNTER — Ambulatory Visit (HOSPITAL_BASED_OUTPATIENT_CLINIC_OR_DEPARTMENT_OTHER): Payer: BC Managed Care – PPO | Admitting: Anesthesiology

## 2020-05-22 ENCOUNTER — Other Ambulatory Visit: Payer: Self-pay

## 2020-05-22 DIAGNOSIS — Z87891 Personal history of nicotine dependence: Secondary | ICD-10-CM | POA: Insufficient documentation

## 2020-05-22 DIAGNOSIS — D250 Submucous leiomyoma of uterus: Secondary | ICD-10-CM | POA: Insufficient documentation

## 2020-05-22 DIAGNOSIS — Z888 Allergy status to other drugs, medicaments and biological substances status: Secondary | ICD-10-CM | POA: Insufficient documentation

## 2020-05-22 DIAGNOSIS — N920 Excessive and frequent menstruation with regular cycle: Secondary | ICD-10-CM | POA: Insufficient documentation

## 2020-05-22 DIAGNOSIS — N946 Dysmenorrhea, unspecified: Secondary | ICD-10-CM | POA: Insufficient documentation

## 2020-05-22 HISTORY — DX: Nausea with vomiting, unspecified: R11.2

## 2020-05-22 HISTORY — DX: Submucous leiomyoma of uterus: D25.0

## 2020-05-22 HISTORY — DX: Other specified postprocedural states: Z98.890

## 2020-05-22 HISTORY — DX: Anemia, unspecified: D64.9

## 2020-05-22 HISTORY — PX: DILATATION & CURETTAGE/HYSTEROSCOPY WITH MYOSURE: SHX6511

## 2020-05-22 HISTORY — DX: Anxiety disorder, unspecified: F41.9

## 2020-05-22 LAB — CBC
HCT: 40.8 % (ref 36.0–46.0)
Hemoglobin: 13.8 g/dL (ref 12.0–15.0)
MCH: 30.6 pg (ref 26.0–34.0)
MCHC: 33.8 g/dL (ref 30.0–36.0)
MCV: 90.5 fL (ref 80.0–100.0)
Platelets: 339 10*3/uL (ref 150–400)
RBC: 4.51 MIL/uL (ref 3.87–5.11)
RDW: 12.4 % (ref 11.5–15.5)
WBC: 9 10*3/uL (ref 4.0–10.5)
nRBC: 0 % (ref 0.0–0.2)

## 2020-05-22 LAB — POCT PREGNANCY, URINE: Preg Test, Ur: NEGATIVE

## 2020-05-22 SURGERY — DILATATION & CURETTAGE/HYSTEROSCOPY WITH MYOSURE
Anesthesia: General

## 2020-05-22 MED ORDER — PROPOFOL 10 MG/ML IV BOLUS
INTRAVENOUS | Status: DC | PRN
  Administered 2020-05-22: 200 mg via INTRAVENOUS

## 2020-05-22 MED ORDER — OXYCODONE HCL 5 MG PO TABS
ORAL_TABLET | ORAL | Status: AC
  Filled 2020-05-22: qty 1

## 2020-05-22 MED ORDER — ONDANSETRON HCL 4 MG/2ML IJ SOLN
INTRAMUSCULAR | Status: DC | PRN
  Administered 2020-05-22: 4 mg via INTRAVENOUS

## 2020-05-22 MED ORDER — CEFAZOLIN SODIUM-DEXTROSE 2-4 GM/100ML-% IV SOLN
INTRAVENOUS | Status: AC
  Filled 2020-05-22: qty 100

## 2020-05-22 MED ORDER — KETOROLAC TROMETHAMINE 30 MG/ML IJ SOLN
INTRAMUSCULAR | Status: AC
  Filled 2020-05-22: qty 1

## 2020-05-22 MED ORDER — FENTANYL CITRATE (PF) 100 MCG/2ML IJ SOLN
INTRAMUSCULAR | Status: DC | PRN
  Administered 2020-05-22 (×2): 25 ug via INTRAVENOUS
  Administered 2020-05-22: 50 ug via INTRAVENOUS

## 2020-05-22 MED ORDER — POVIDONE-IODINE 10 % EX SWAB: 2.0000 "application " | Freq: Once | CUTANEOUS | Status: DC

## 2020-05-22 MED ORDER — DEXAMETHASONE SODIUM PHOSPHATE 10 MG/ML IJ SOLN
INTRAMUSCULAR | Status: DC | PRN
  Administered 2020-05-22: 10 mg via INTRAVENOUS

## 2020-05-22 MED ORDER — DEXMEDETOMIDINE (PRECEDEX) IN NS 20 MCG/5ML (4 MCG/ML) IV SYRINGE
PREFILLED_SYRINGE | INTRAVENOUS | Status: AC
  Filled 2020-05-22: qty 5

## 2020-05-22 MED ORDER — MIDAZOLAM HCL 2 MG/2ML IJ SOLN
1.0000 mg | Freq: Once | INTRAMUSCULAR | Status: AC
  Administered 2020-05-22: 1 mg via INTRAVENOUS

## 2020-05-22 MED ORDER — MIDAZOLAM HCL 2 MG/2ML IJ SOLN
INTRAMUSCULAR | Status: AC
  Filled 2020-05-22: qty 2

## 2020-05-22 MED ORDER — OXYCODONE HCL 5 MG/5ML PO SOLN: 5.0000 mg | Freq: Once | ORAL | Status: AC | PRN

## 2020-05-22 MED ORDER — CEFAZOLIN SODIUM-DEXTROSE 2-4 GM/100ML-% IV SOLN
2.0000 g | INTRAVENOUS | Status: AC
  Administered 2020-05-22: 2 g via INTRAVENOUS

## 2020-05-22 MED ORDER — FENTANYL CITRATE (PF) 100 MCG/2ML IJ SOLN: 25.0000 ug | INTRAMUSCULAR | Status: DC | PRN

## 2020-05-22 MED ORDER — LIDOCAINE HCL (CARDIAC) PF 100 MG/5ML IV SOSY
PREFILLED_SYRINGE | INTRAVENOUS | Status: DC | PRN
  Administered 2020-05-22: 50 mg via INTRAVENOUS

## 2020-05-22 MED ORDER — DEXAMETHASONE SODIUM PHOSPHATE 10 MG/ML IJ SOLN
INTRAMUSCULAR | Status: AC
  Filled 2020-05-22: qty 1

## 2020-05-22 MED ORDER — DEXMEDETOMIDINE (PRECEDEX) IN NS 20 MCG/5ML (4 MCG/ML) IV SYRINGE
PREFILLED_SYRINGE | INTRAVENOUS | Status: DC | PRN
  Administered 2020-05-22: 4 ug via INTRAVENOUS

## 2020-05-22 MED ORDER — TRANEXAMIC ACID-NACL 1000-0.7 MG/100ML-% IV SOLN: 1000.0000 mg | INTRAVENOUS | Status: DC

## 2020-05-22 MED ORDER — PROPOFOL 10 MG/ML IV BOLUS
INTRAVENOUS | Status: AC
  Filled 2020-05-22: qty 20

## 2020-05-22 MED ORDER — MIDAZOLAM HCL 2 MG/2ML IJ SOLN
INTRAMUSCULAR | Status: DC | PRN
  Administered 2020-05-22: 2 mg via INTRAVENOUS

## 2020-05-22 MED ORDER — LACTATED RINGERS IV SOLN: INTRAVENOUS | Status: DC

## 2020-05-22 MED ORDER — KETOROLAC TROMETHAMINE 30 MG/ML IJ SOLN
INTRAMUSCULAR | Status: DC | PRN
  Administered 2020-05-22: 30 mg via INTRAVENOUS

## 2020-05-22 MED ORDER — SCOPOLAMINE 1 MG/3DAYS TD PT72
MEDICATED_PATCH | TRANSDERMAL | Status: AC
  Filled 2020-05-22: qty 1

## 2020-05-22 MED ORDER — TRANEXAMIC ACID-NACL 1000-0.7 MG/100ML-% IV SOLN
INTRAVENOUS | Status: AC
  Filled 2020-05-22: qty 100

## 2020-05-22 MED ORDER — METHYLERGONOVINE MALEATE 0.2 MG/ML IJ SOLN
INTRAMUSCULAR | Status: DC | PRN
  Administered 2020-05-22: .2 mg via INTRAMUSCULAR

## 2020-05-22 MED ORDER — TRANEXAMIC ACID-NACL 1000-0.7 MG/100ML-% IV SOLN
INTRAVENOUS | Status: DC | PRN
  Administered 2020-05-22: 1000 mg via INTRAVENOUS

## 2020-05-22 MED ORDER — ONDANSETRON HCL 4 MG/2ML IJ SOLN
INTRAMUSCULAR | Status: AC
  Filled 2020-05-22: qty 2

## 2020-05-22 MED ORDER — IBUPROFEN 200 MG PO TABS: 600.0000 mg | ORAL_TABLET | Freq: Four times a day (QID) | ORAL | 0 refills | Status: DC | PRN

## 2020-05-22 MED ORDER — LIDOCAINE-EPINEPHRINE 1 %-1:100000 IJ SOLN
INTRAMUSCULAR | Status: DC | PRN
  Administered 2020-05-22: 20 mL

## 2020-05-22 MED ORDER — LIDOCAINE 2% (20 MG/ML) 5 ML SYRINGE
INTRAMUSCULAR | Status: AC
  Filled 2020-05-22: qty 5

## 2020-05-22 MED ORDER — DROPERIDOL 2.5 MG/ML IJ SOLN
INTRAMUSCULAR | Status: DC | PRN
  Administered 2020-05-22: .625 mg via INTRAVENOUS

## 2020-05-22 MED ORDER — SCOPOLAMINE 1 MG/3DAYS TD PT72
1.0000 | MEDICATED_PATCH | TRANSDERMAL | Status: DC
  Administered 2020-05-22: 1.5 mg via TRANSDERMAL

## 2020-05-22 MED ORDER — ACETAMINOPHEN 10 MG/ML IV SOLN: 1000.0000 mg | Freq: Once | INTRAVENOUS | Status: DC | PRN

## 2020-05-22 MED ORDER — FENTANYL CITRATE (PF) 100 MCG/2ML IJ SOLN
INTRAMUSCULAR | Status: AC
  Filled 2020-05-22: qty 2

## 2020-05-22 MED ORDER — OXYCODONE HCL 5 MG PO TABS
5.0000 mg | ORAL_TABLET | Freq: Once | ORAL | Status: AC | PRN
  Administered 2020-05-22: 5 mg via ORAL

## 2020-05-22 SURGICAL SUPPLY — 21 items
CATH FOLEY 2WAY SLVR  5CC 16FR (CATHETERS) ×3
CATH FOLEY 2WAY SLVR 5CC 16FR (CATHETERS) IMPLANT
CATH ROBINSON RED A/P 16FR (CATHETERS) ×3 IMPLANT
COVER WAND RF STERILE (DRAPES) ×3 IMPLANT
DECANTER SPIKE VIAL GLASS SM (MISCELLANEOUS) ×3 IMPLANT
DEVICE MYOSURE LITE (MISCELLANEOUS) IMPLANT
DEVICE MYOSURE REACH (MISCELLANEOUS) IMPLANT
GAUZE 4X4 16PLY RFD (DISPOSABLE) ×3 IMPLANT
GLOVE BIO SURGEON STRL SZ7 (GLOVE) ×3 IMPLANT
GLOVE BIOGEL PI IND STRL 7.0 (GLOVE) ×2 IMPLANT
GLOVE BIOGEL PI INDICATOR 7.0 (GLOVE) ×4
GOWN STRL REUS W/TWL LRG LVL3 (GOWN DISPOSABLE) ×3 IMPLANT
IV NS IRRIG 3000ML ARTHROMATIC (IV SOLUTION) ×5 IMPLANT
KIT PROCEDURE FLUENT (KITS) ×3 IMPLANT
MYOSURE XL FIBROID REM (MISCELLANEOUS) ×3
PACK VAGINAL MINOR WOMEN LF (CUSTOM PROCEDURE TRAY) ×3 IMPLANT
PAD OB MATERNITY 4.3X12.25 (PERSONAL CARE ITEMS) ×3 IMPLANT
SEAL CERVICAL OMNI LOK (ABLATOR) IMPLANT
SEAL ROD LENS SCOPE MYOSURE (ABLATOR) ×3 IMPLANT
SYSTEM TISS REMOVAL MYSR XL RM (MISCELLANEOUS) IMPLANT
TOWEL OR 17X26 10 PK STRL BLUE (TOWEL DISPOSABLE) ×3 IMPLANT

## 2020-05-22 NOTE — Op Note (Signed)
Preoperative diagnosis: Menorrhagia, dysmenorrhea, submucosal myomas Postop diagnosis: as above.  Procedure: Hysteroscopic submucosal myoma resection with Myosure Anesthesia General via LMA  Surgeon: Azucena Fallen, MD  Assistant:  none IV fluids 1000 lit LR Estimated blood loss 100 cc Saline fluid deficit: 1500 cc Urine output: straight catheter preop 50 cc Complications none  Condition stable  Disposition PACU  Specimen: resected myoma fragments    Procedure  Indication: Menorrhagia, dysmenorrhea, submucosal myomas. . Office saline sonohystogram noted 2.7 cm submucosal myoma and another small myoma or a polyp. Patient was counseled on risks/ complications including infection, bleeding, damage to internal organs, she understood and agrees, gave informed written consent.  Patient was brought to the operating room with IV running. Time out was carried out. She received preop 2 gm Ancef. She underwent general anesthesia via LMA without complications. She was given dorsolithotomy position. Parts were prepped and draped in standard fashion. Bladder was catheterized once. Bimanual exam revealed uterus to be anteverted and normal size. Speculum was placed and cervix was grasped with single-tooth tenaculum. Cervical block with 20 cc 1% Xylocaine with Epinephrine given. The uterus was sounded to 7 cm. Cervical os was dilated to 21 [place] dilator.  Myosure XL introduced in the uterine cavity under vision, using saline for irrigation.  Findings: Large left lateral wall submucous myoma and another 2 smaller <1 cm myomas noted, normal tubal ostii noted.  Hysteroscopic myoma resection performed with Myosure XL under direct vision and good control.  Fluid deficit 1500 cc.  At the end of the procedure profuse bleeding noted from resected fibroid bed (from the larger myoma), so Methergine 0.2 mg IM given and TXA given. Also, intrauterine foley catheter placed for tamponade that was removed before patient woke  up and brought to PACU.  All counts are correct x2. No complications. Patient to PACU in stable condition.  Patient will be discharged home today. Post op care and warning signs especially heavy bleeding reviewed with spouse. I performed this surgery.   Azucena Fallen, MD.

## 2020-05-22 NOTE — Anesthesia Preprocedure Evaluation (Signed)
Anesthesia Evaluation  Patient identified by MRN, date of birth, ID band Patient awake    Reviewed: Allergy & Precautions, NPO status , Patient's Chart, lab work & pertinent test results  History of Anesthesia Complications (+) PONV  Airway Mallampati: II  TM Distance: >3 FB Neck ROM: Full    Dental no notable dental hx.    Pulmonary neg pulmonary ROS, former smoker,    Pulmonary exam normal breath sounds clear to auscultation       Cardiovascular negative cardio ROS Normal cardiovascular exam Rhythm:Regular Rate:Normal     Neuro/Psych negative neurological ROS  negative psych ROS   GI/Hepatic negative GI ROS, Neg liver ROS,   Endo/Other  negative endocrine ROS  Renal/GU negative Renal ROS  negative genitourinary   Musculoskeletal negative musculoskeletal ROS (+)   Abdominal   Peds negative pediatric ROS (+)  Hematology negative hematology ROS (+)   Anesthesia Other Findings   Reproductive/Obstetrics negative OB ROS                             Anesthesia Physical Anesthesia Plan  ASA: I  Anesthesia Plan: General   Post-op Pain Management:    Induction: Intravenous  PONV Risk Score and Plan: 4 or greater and Ondansetron, Dexamethasone, Midazolam, Treatment may vary due to age or medical condition and Droperidol  Airway Management Planned: LMA  Additional Equipment:   Intra-op Plan:   Post-operative Plan: Extubation in OR  Informed Consent: I have reviewed the patients History and Physical, chart, labs and discussed the procedure including the risks, benefits and alternatives for the proposed anesthesia with the patient or authorized representative who has indicated his/her understanding and acceptance.     Dental advisory given  Plan Discussed with: CRNA and Surgeon  Anesthesia Plan Comments:         Anesthesia Quick Evaluation

## 2020-05-22 NOTE — Transfer of Care (Signed)
Immediate Anesthesia Transfer of Care Note  Patient: Michele Andersen  Procedure(s) Performed: DILATATION & CURETTAGE/HYSTEROSCOPY WITH MYOSURE (N/A )  Patient Location: PACU  Anesthesia Type:General  Level of Consciousness: awake, alert  and oriented  Airway & Oxygen Therapy: Patient Spontanous Breathing and Patient connected to nasal cannula oxygen  Post-op Assessment: Report given to RN and Post -op Vital signs reviewed and stable  Post vital signs: Reviewed and stable  Last Vitals:  Vitals Value Taken Time  BP 141/83 05/22/20 1045  Temp    Pulse 73 05/22/20 1058  Resp 13 05/22/20 1058  SpO2 100 % 05/22/20 1058  Vitals shown include unvalidated device data.  Last Pain:  Vitals:   05/22/20 0755  TempSrc: Oral  PainSc: 4       Patients Stated Pain Goal: 4 (22/33/61 2244)  Complications: No complications documented.

## 2020-05-22 NOTE — Anesthesia Procedure Notes (Signed)
Procedure Name: LMA Insertion Date/Time: 05/22/2020 9:36 AM Performed by: Bufford Spikes, CRNA Pre-anesthesia Checklist: Patient identified, Emergency Drugs available, Suction available and Patient being monitored Patient Re-evaluated:Patient Re-evaluated prior to induction Oxygen Delivery Method: Circle system utilized Preoxygenation: Pre-oxygenation with 100% oxygen Induction Type: IV induction Ventilation: Mask ventilation without difficulty LMA: LMA inserted LMA Size: 4.0 Number of attempts: 1 Airway Equipment and Method: Bite block Placement Confirmation: positive ETCO2 Tube secured with: Tape Dental Injury: Teeth and Oropharynx as per pre-operative assessment

## 2020-05-22 NOTE — Anesthesia Postprocedure Evaluation (Signed)
Anesthesia Post Note  Patient: Michele Andersen  Procedure(s) Performed: [place] (N/A )     Patient location during evaluation: PACU Anesthesia Type: General Level of consciousness: awake and alert Pain management: pain level controlled Vital Signs Assessment: post-procedure vital signs reviewed and stable Respiratory status: spontaneous breathing, nonlabored ventilation, respiratory function stable and patient connected to nasal cannula oxygen Cardiovascular status: blood pressure returned to baseline and stable Postop Assessment: no apparent nausea or vomiting Anesthetic complications: no   No complications documented.  Last Vitals:  Vitals:   05/22/20 1100 05/22/20 1115  BP: 135/88   Pulse: 73 90  Resp: 13 14  Temp:    SpO2: 100% 100%    Last Pain:  Vitals:   05/22/20 1100  TempSrc:   PainSc: 0-No pain                 Teala Daffron S

## 2020-05-22 NOTE — Discharge Instructions (Addendum)
Hysteroscopy, Care After This sheet gives you information about how to care for yourself after your procedure. Your health care provider may also give you more specific instructions. If you have problems or questions, contact your health care provider. What can I expect after the procedure? After the procedure, it is common to have:  Cramping.  Bleeding. This can vary from light spotting to menstrual-like bleeding. Follow these instructions at home: Activity  Rest for 1-2 days after the procedure.  Do not douche, use tampons, or have sex for 2 weeks after the procedure, or until your health care provider approves.  Do not drive for 24 hours after the procedure, or for as long as told by your health care provider.  Do not drive, use heavy machinery, or drink alcohol while taking prescription pain medicines. Medicines   Take over-the-counter and prescription medicines only as told by your health care provider.  Do not take aspirin during recovery. It can increase the risk of bleeding. General instructions  Do not take baths, swim, or use a hot tub until your health care provider approves. Take showers instead of baths for 2 weeks, or for as long as told by your health care provider.  To prevent or treat constipation while you are taking prescription pain medicine, your health care provider may recommend that you: ? Drink enough fluid to keep your urine clear or pale yellow. ? Take over-the-counter or prescription medicines. ? Eat foods that are high in fiber, such as fresh fruits and vegetables, whole grains, and beans. ? Limit foods that are high in fat and processed sugars, such as fried and sweet foods.  Keep all follow-up visits as told by your health care provider. This is important. Contact a health care provider if:  You feel dizzy or lightheaded.  You feel nauseous.  You have abnormal vaginal discharge.  You have a rash.  You have pain that does not get better with  medicine.  You have chills. Get help right away if:  You have bleeding that is heavier than a normal menstrual period.  You have a fever.  You have pain or cramps that get worse.  You develop new abdominal pain.  You faint.  You have pain in your shoulders.  You have shortness of breath. Summary  After the procedure, you may have cramping and some vaginal bleeding.  Do not douche, use tampons, or have sex for 2 weeks after the procedure, or until your health care provider approves.  Do not take baths, swim, or use a hot tub until your health care provider approves. Take showers instead of baths for 2 weeks, or for as long as told by your health care provider.  Report any unusual symptoms to your health care provider.  Keep all follow-up visits as told by your health care provider. This is important. This information is not intended to replace advice given to you by your health care provider. Make sure you discuss any questions you have with your health care provider. Document Revised: 07/21/2017 Document Reviewed: 09/06/2016 Elsevier Patient Education  [employer]    Post Anesthesia Home Care Instructions  Activity: Get plenty of rest for the remainder of the day. A responsible individual must stay with you for 24 hours following the procedure.  For the next 24 hours, DO NOT: -Drive a car -Paediatric nurse -Drink alcoholic beverages -Take any medication unless instructed by your physician -Make any legal decisions or sign important papers.  Meals: Start with liquid  foods such as gelatin or soup. Progress to regular foods as tolerated. Avoid greasy, spicy, heavy foods. If nausea and/or vomiting occur, drink only clear liquids until the nausea and/or vomiting subsides. Call your physician if vomiting continues.  Special Instructions/Symptoms: Your throat may feel dry or sore from the anesthesia or the breathing tube placed in your throat during surgery. If this  causes discomfort, gargle with warm salt water. The discomfort should disappear within 24 hours.

## 2020-05-25 ENCOUNTER — Encounter (HOSPITAL_BASED_OUTPATIENT_CLINIC_OR_DEPARTMENT_OTHER): Payer: Self-pay | Admitting: Obstetrics & Gynecology

## 2020-05-25 LAB — SURGICAL PATHOLOGY

## 2020-06-11 IMAGING — DX DG CHEST 2V
2 series · 2 of 2 positions shown · non-contrast
Comparison: None.

CLINICAL DATA: Cough and wheezing.  Right-sided chest pain.

EXAM:
CHEST - 2 VIEW

[chest pa]
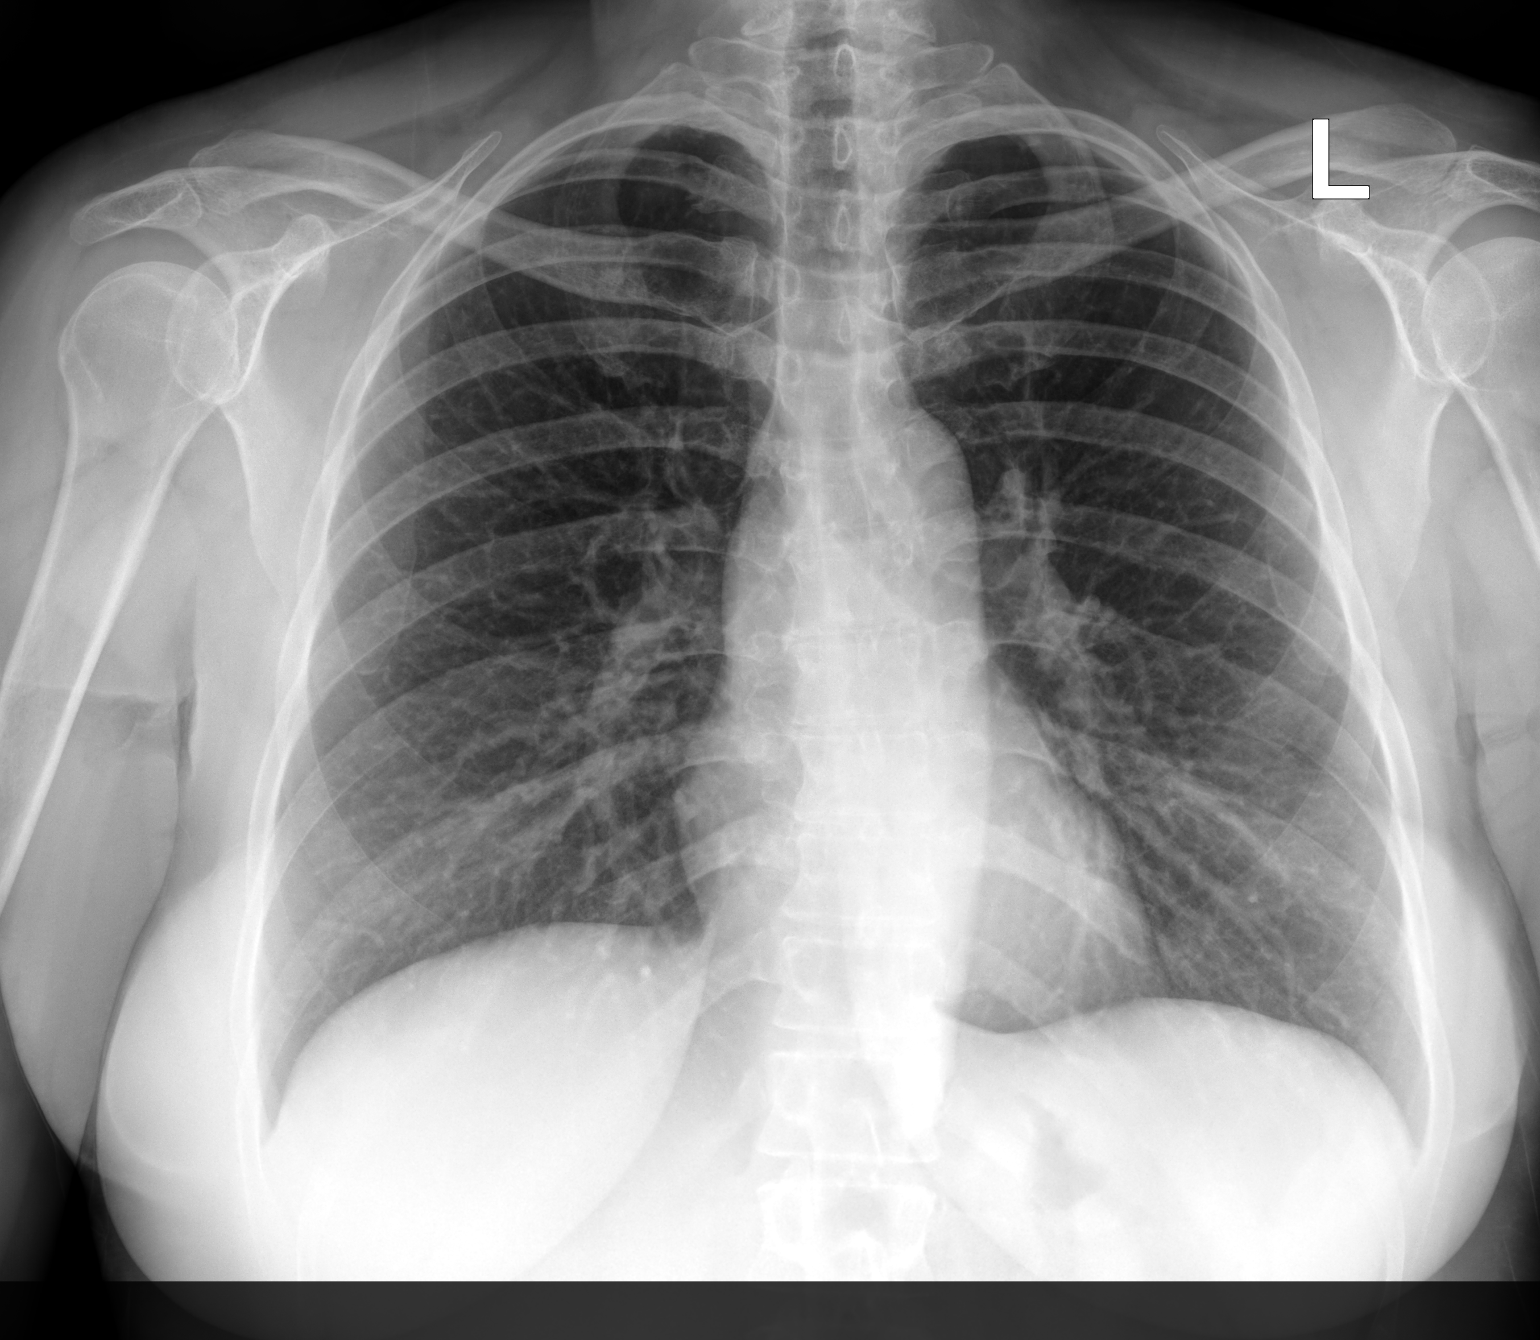

[chest lat]
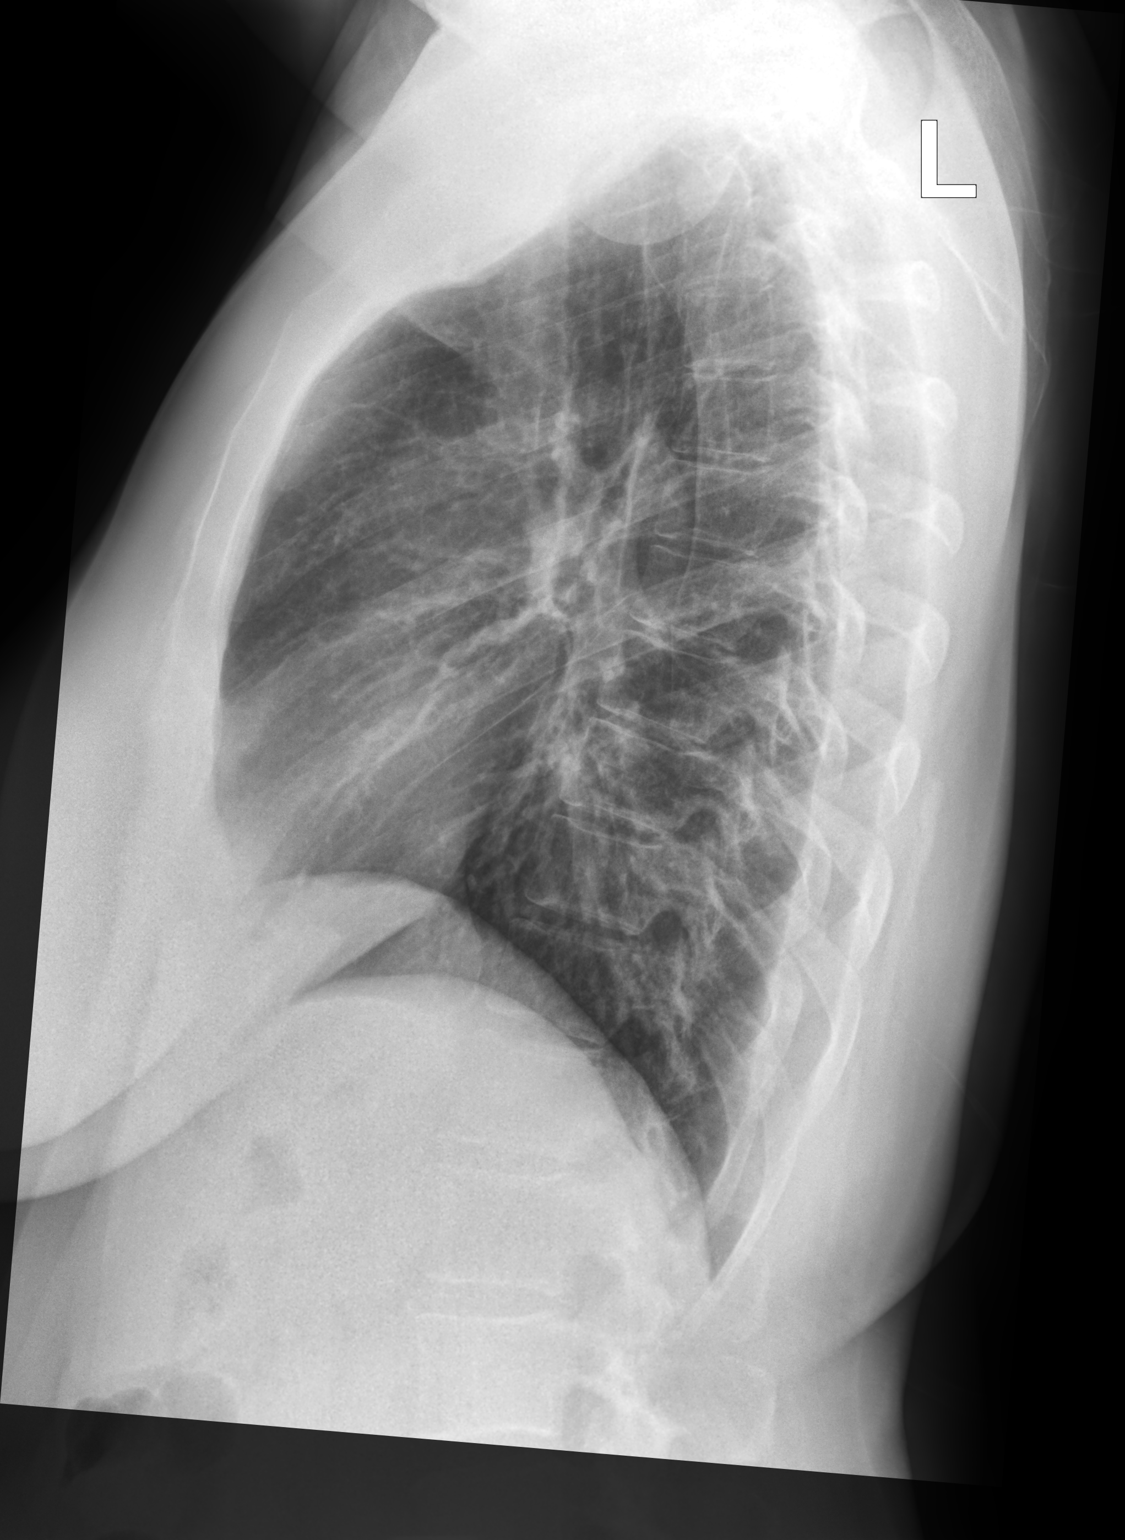

[2 of 2 positions shown; findings below may reference images not displayed]

FINDINGS: There is an 8 mm irregular density overlying the junction of the
right clavicle and first rib. This could be a bone island but I
cannot exclude a small nodule at the right lung apex. The lungs are
otherwise clear. Heart size and vascularity are normal. No
effusions.
IMPRESSION: Small nodular density overlying the right lung apex. I recommend
apical lordotic view to determine the location of the small dense
lesion.

Otherwise normal chest.

## 2020-08-12 ENCOUNTER — Ambulatory Visit (INDEPENDENT_AMBULATORY_CARE_PROVIDER_SITE_OTHER): Payer: BC Managed Care – PPO | Admitting: Family Medicine

## 2020-08-12 ENCOUNTER — Encounter: Payer: Self-pay | Admitting: Family Medicine

## 2020-08-12 ENCOUNTER — Other Ambulatory Visit: Payer: Self-pay

## 2020-08-12 VITALS — BP 120/76 | HR 81 | Temp 98.2°F | Ht 63.0 in | Wt 128.2 lb

## 2020-08-12 DIAGNOSIS — R002 Palpitations: Secondary | ICD-10-CM | POA: Diagnosis not present

## 2020-08-12 DIAGNOSIS — F419 Anxiety disorder, unspecified: Secondary | ICD-10-CM

## 2020-08-12 DIAGNOSIS — I493 Ventricular premature depolarization: Secondary | ICD-10-CM

## 2020-08-12 MED ORDER — ESCITALOPRAM OXALATE 10 MG PO TABS: 10.0000 mg | ORAL_TABLET | Freq: Every day | ORAL | 5 refills | Status: DC

## 2020-08-12 NOTE — Progress Notes (Signed)
Established Patient Office Visit  Subjective:  Patient ID: Michele Andersen, female    DOB: 01-Jul-1976  Age: 44 y.o. MRN: [id number]  CC:  Chief Complaint  Patient presents with  . Tachycardia    C/O tachycardia 5 days ago that have become better, patient states that she is still having some issues with anxiety.   . Constipation    HPI Michele Andersen presents for discussion regarding recent tachypalpitations and anxiety issues.  She had significant palpitations last winter.  We set up event monitor and this showed some PVCs.  She states that these tend to come periodically and they can go away for periods of time.  She had declined previous beta-blocker therapy.  Previous electrolytes have been normal.  She had particularly severe episode Friday.  She had what felt like some epigastric to chest tightness and discomfort.  She called 911.  She had 2 cups of coffee which is her usual that morning.  EKG looked normal.  Only occasional PVC on strip but no acute ST-T changes.  Her blood pressure slightly elevated.  No further evaluation was done. No associated nausea or vomiting.  No consistent postprandial symptoms.  Denies any palpitations at this time.  She walks some for exercise and has not had any chest pain with exercise.  She denies any swallowing difficulties.  No appetite or weight changes.  She does have some chronic anxiety and some mild depression symptoms as well.  She previously took Prozac and may have taken Celexa years ago.  Denies any specific stressors.  She does have fairly stressful job.  Works at [employer].  Marriage is going well and no issues with her marriage.  No regular alcohol use.  She does relate some issues with social anxiety even now with difficulty going in crowds.  She states she tends to have pervasive negative thoughts that she cannot get out of her mind.  No delusional thinking.  Past Medical History:  Diagnosis Date  . Allergy   . Anemia   . Anxiety    . Depression   . Eczema   . PONV (postoperative nausea and vomiting)   . Submucous myoma of uterus   . 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 DILATATION & CURETTAGE/HYSTEROSCOPY WITH MYOSURE N/A 05/22/2020   Procedure: [place];  Surgeon: Azucena Fallen, MD;  Location: [facility];  Service: Gynecology;  Laterality: N/A;  . right arm surgery     right arm x 1 2017 right wrist x 1 11-2019 surgical [facility] of [place]    Family History  Problem Relation Age of Onset  . Hyperlipidemia Mother   . Diabetes Father   . Hyperlipidemia Father     Social History   Socioeconomic History  . Marital status: Married    Spouse name: Not on file  . Number of children: Not on file  . Years of education: Not on file  . Highest education level: Not on file  Occupational History  . Not on file  Tobacco Use  . Smoking status: Former Smoker    Packs/day: 0.50    Years: 4.00    Pack years: 2.00    Types: Cigarettes    Quit date: 08/22/2002    Years since quitting: 17.9  . Smokeless tobacco: Never Used  Vaping Use  . Vaping Use: Never used  Substance and Sexual Activity  . Alcohol use: Yes  Comment: occ   . Drug use: No  . Sexual activity: Not on file  Other Topics Concern  . Not on file  Social History Narrative  . Not on file   Social Determinants of Health   Financial Resource Strain: Not on file  Food Insecurity: Not on file  Transportation Needs: Not on file  Physical Activity: Not on file  Stress: Not on file  Social Connections: Not on file  Intimate Partner Violence: Not on file    Outpatient Medications Prior to Visit  Medication Sig Dispense Refill  . cetirizine (ZYRTEC) 10 MG tablet Take 10 mg by mouth daily as needed. For allergies    . Ferrous Sulfate (SLOW FE PO) Take by mouth daily.    . Multiple Vitamins-Minerals (MULTIVITAMIN WITH MINERALS) tablet  Take 1 tablet by mouth daily.    Marland Kitchen ibuprofen (ADVIL) 200 MG tablet Take 3 tablets (600 mg total) by mouth every 6 (six) hours as needed for mild pain or moderate pain. 30 tablet 0   No facility-administered medications prior to visit.    Allergies  Allergen Reactions  . Thimerosal Itching    ROS Review of Systems  Constitutional: Negative for appetite change, chills, fever and unexpected weight change.  Respiratory: Negative for cough and shortness of breath.   Cardiovascular: Positive for chest pain and palpitations. Negative for leg swelling.  Gastrointestinal: Negative for abdominal pain.  Psychiatric/Behavioral: Positive for dysphoric mood. Negative for agitation and confusion. The patient is nervous/anxious.       Objective:    Physical Exam Vitals reviewed.  Constitutional:      Appearance: Normal appearance.  Cardiovascular:     Rate and Rhythm: Normal rate and regular rhythm.     Heart sounds: No murmur heard.   Pulmonary:     Effort: Pulmonary effort is normal.     Breath sounds: Normal breath sounds.  Musculoskeletal:     Cervical back: Neck supple.     Right lower leg: No edema.     Left lower leg: No edema.  Lymphadenopathy:     Cervical: No cervical adenopathy.  Neurological:     General: No focal deficit present.     Mental Status: She is alert.     BP 120/76   Pulse 81   Temp 98.2 F (36.8 C) (Oral)   Ht 5\' 3"  (1.6 m)   Wt 128 lb 3.2 oz (58.2 kg)   SpO2 98%   BMI 22.71 kg/m  Wt Readings from Last 3 Encounters:  08/12/20 128 lb 3.2 oz (58.2 kg)  05/22/20 162 lb (73.5 kg)  09/13/19 134 lb 8 oz (61 kg)     Health Maintenance Due  Topic Date Due  . Hepatitis C Screening  Never done  . HIV Screening  Never done  . TETANUS/TDAP  08/12/2020    There are no preventive care reminders to display for this patient.  Lab Results  Component Value Date   TSH 1.46 09/13/2019   Lab Results  Component Value Date   WBC 9.0 05/22/2020   HGB  13.8 05/22/2020   HCT 40.8 05/22/2020   MCV 90.5 05/22/2020   PLT 339 05/22/2020   Lab Results  Component Value Date   NA 141 09/13/2019   K 4.2 09/13/2019   CO2 27 09/13/2019   GLUCOSE 90 09/13/2019   BUN 13 09/13/2019   CREATININE 0.64 09/13/2019   BILITOT 0.3 08/06/2010   ALKPHOS 40 08/06/2010   AST 11 08/06/2010  ALT 10 08/06/2010   PROT 6.3 08/06/2010   ALBUMIN 3.4 (L) 08/06/2010   CALCIUM 9.2 09/13/2019   GFR 100.27 09/13/2019   Lab Results  Component Value Date   CHOL 169 08/06/2010   Lab Results  Component Value Date   HDL 34.10 (L) 08/06/2010   Lab Results  Component Value Date   LDLCALC 110 (H) 08/06/2010   Lab Results  Component Value Date   TRIG 124.0 08/06/2010   Lab Results  Component Value Date   CHOLHDL 5 08/06/2010   No results found for: HGBA1C    Assessment & Plan:   #1 intermittent palpitations.  History of PVCs.  Normal exam today.  Recent EKG strip was unremarkable.  She does not have any worrisome symptoms such as exertional dyspnea or chest pain.  -We again reiterated importance of minimizing caffeine consumption -Continue regular exercise habits -Discussed possible as needed use of low-dose beta-blocker but she declines at this time unless symptoms worsen  #2 chronic intermittent anxiety.  It sounds like she has some issues of social anxiety possibly some generalized anxiety as well.  She does have almost daily pervasive worries.  -We discussed trial of Lexapro 10 mg 1/2 tablet daily for 3 to 4 days then titrate to 1 tablet daily as tolerated.  Meds ordered this encounter  Medications  . escitalopram (LEXAPRO) 10 MG tablet    Sig: Take 1 tablet (10 mg total) by mouth daily.    Dispense:  30 tablet    Refill:  5    Follow-up: Return in about 1 month (around 09/12/2020).    Carolann Littler, MD

## 2020-08-12 NOTE — Patient Instructions (Signed)

## 2020-08-13 ENCOUNTER — Other Ambulatory Visit: Payer: Self-pay

## 2020-08-13 ENCOUNTER — Emergency Department (HOSPITAL_COMMUNITY)
Admission: EM | Admit: 2020-08-13 | Discharge: 2020-08-13 | Disposition: A | Discharge status: Home / Self Care | Payer: BC Managed Care – PPO | Attending: Emergency Medicine | Admitting: Emergency Medicine

## 2020-08-13 ENCOUNTER — Emergency Department (HOSPITAL_COMMUNITY): Payer: BC Managed Care – PPO

## 2020-08-13 ENCOUNTER — Encounter (HOSPITAL_COMMUNITY): Payer: Self-pay

## 2020-08-13 DIAGNOSIS — R072 Precordial pain: Secondary | ICD-10-CM | POA: Diagnosis not present

## 2020-08-13 DIAGNOSIS — Z87891 Personal history of nicotine dependence: Secondary | ICD-10-CM | POA: Diagnosis not present

## 2020-08-13 DIAGNOSIS — R42 Dizziness and giddiness: Secondary | ICD-10-CM | POA: Insufficient documentation

## 2020-08-13 DIAGNOSIS — R002 Palpitations: Secondary | ICD-10-CM | POA: Insufficient documentation

## 2020-08-13 LAB — I-STAT BETA HCG BLOOD, ED (MC, WL, AP ONLY): I-stat hCG, quantitative: <5 m[IU]/mL (ref <5)

## 2020-08-13 LAB — TROPONIN I (HIGH SENSITIVITY)
Troponin I (High Sensitivity): <2 ng/L (ref <18)
Troponin I (High Sensitivity): 3 ng/L (ref <18)

## 2020-08-13 LAB — BASIC METABOLIC PANEL
Anion gap: 13 (ref 5–15)
BUN: 7 mg/dL (ref 6–20)
CO2: 21 mmol/L — ABNORMAL LOW (ref 22–32)
Calcium: 9.8 mg/dL (ref 8.9–10.3)
Chloride: 104 mmol/L (ref 98–111)
Creatinine, Ser: 0.53 mg/dL (ref 0.44–1.00)
GFR, Estimated: >60 mL/min (ref >60)
Glucose, Bld: 107 mg/dL — ABNORMAL HIGH (ref 70–99)
Potassium: 3.4 mmol/L — ABNORMAL LOW (ref 3.5–5.1)
Sodium: 138 mmol/L (ref 135–145)

## 2020-08-13 LAB — D-DIMER, QUANTITATIVE: D-Dimer, Quant: <0.27 ug/mL-FEU (ref 0.00–0.50)

## 2020-08-13 LAB — CBC
HCT: 41 % (ref 36.0–46.0)
Hemoglobin: 14.5 g/dL (ref 12.0–15.0)
MCH: 30.6 pg (ref 26.0–34.0)
MCHC: 35.4 g/dL (ref 30.0–36.0)
MCV: 86.5 fL (ref 80.0–100.0)
Platelets: 319 10*3/uL (ref 150–400)
RBC: 4.74 MIL/uL (ref 3.87–5.11)
RDW: 12.2 % (ref 11.5–15.5)
WBC: 8.8 10*3/uL (ref 4.0–10.5)
nRBC: 0 % (ref 0.0–0.2)

## 2020-08-13 MED ORDER — LACTATED RINGERS IV BOLUS
1000.0000 mL | Freq: Once | INTRAVENOUS | Status: AC
  Administered 2020-08-13: 18:00:00 1000 mL via INTRAVENOUS

## 2020-08-13 NOTE — ED Notes (Signed)
Pt urine sample colleted in triage. No order for same att. Pt has urine sample on hand if needed during visit

## 2020-08-13 NOTE — ED Provider Notes (Signed)
Emergency Department Provider Note   I have reviewed the triage vital signs and the nursing notes.   HISTORY  Chief Complaint Dizziness and Palpitations   HPI Michele Andersen is a 44 y.o. female with past medical history reviewed below presents to the emergency department with her palpitations and lightheadedness at work today.  Patient has had her palpitations in the past and is worn a Holter monitor in the last 2 years with PVCs but no other clear diagnosis.  Today, she was at work and using the restroom when she developed heart palpitations.  She placed her fingers on her wrist to feel her pulse and states it felt like it was skipping beats.  She then felt a pause and felt very lightheaded.  She put her head down and symptoms improved.  She was able to go back to discuss symptoms with her boss and then had to lie on the floor because she felt that symptoms were coming back.  Her boss had an apple watch which she placed on her wrist but no clear diagnosis was made at that point.  Ultimately, EMS was called.  She never had any chest pain or shortness of breath.  She did not lose consciousness.  She did start Lexapro yesterday after thinking some of these intermittent symptoms could have been related to anxiety after discussion with her PCP. No rash or other symptoms since starting new medication.   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   Cyst in hand left 02/14/2012   LOW HDL 08/12/2010   ALLERGIC RHINITIS 08/12/2010   CONJUNCTIVITIS 11/12/2007   DEPRESSION 07/27/2007   EUSTACHIAN TUBE DYSFUNCTION 07/27/2007   SINUSITIS- ACUTE-NOS 07/27/2007   ECZEMA 07/27/2007    Past Surgical History:   Procedure Laterality Date   DILATATION & CURETTAGE/HYSTEROSCOPY WITH MYOSURE N/A 05/22/2020   Procedure: [place];  Surgeon: Azucena Fallen, MD;  Location: [facility];  Service: Gynecology;  Laterality: N/A;   right arm surgery     right arm x 1 2017 right wrist x 1 11-2019 surgical center of [place]    Allergies Thimerosal  Family History  Problem Relation Age of Onset   Hyperlipidemia Mother    Diabetes Father    Hyperlipidemia Father     Social History Social History   Tobacco Use   Smoking status: Former Smoker    Packs/day: 0.50    Years: 4.00    Pack years: 2.00    Types: Cigarettes    Quit date: 08/22/2002    Years since quitting: 17.9   Smokeless tobacco: Never Used  Scientific laboratory technician Use: Never used  Substance Use Topics   Alcohol use: Yes    Comment: occ    Drug use: No    Review of Systems  Constitutional: No fever/chills Eyes: No visual changes. ENT: No sore throat. Cardiovascular: Denies chest pain. Positive palpitations and near syncope.  Respiratory: Denies shortness of breath. Gastrointestinal: No abdominal pain.  No nausea, no vomiting.  No diarrhea.  No constipation. Genitourinary: Negative for dysuria. Musculoskeletal: Negative for back pain. Skin: Negative for rash. Neurological: Negative  for headaches, focal weakness or numbness.  10-point ROS otherwise negative.  ____________________________________________   PHYSICAL EXAM:  VITAL SIGNS: ED Triage Vitals  Enc Vitals Group     BP 08/13/20 1229 (!) 150/83     Pulse Rate 08/13/20 1229 92     Resp 08/13/20 1229 16     Temp 08/13/20 1229 99 F (37.2 C)     Temp Source 08/13/20 1229 Oral     SpO2 08/13/20 1229 100 %     Weight 08/13/20 1229 125 lb (56.7 kg)     Height 08/13/20 1229 5\' 3"  (1.6 m)   Constitutional: Alert and oriented. Well appearing and in no acute distress. Eyes: Conjunctivae are normal.  Head:  Atraumatic. Nose: No congestion/rhinnorhea. Mouth/Throat: Mucous membranes are moist.  Neck: No stridor.   Cardiovascular: Normal rate, regular rhythm. Good peripheral circulation. Grossly normal heart sounds.   Respiratory: Normal respiratory effort.  No retractions. Lungs CTAB. Gastrointestinal: No distention.  Musculoskeletal: No gross deformities of extremities. Neurologic:  Normal speech and language.  Skin:  Skin is warm, dry and intact. No rash noted.  ____________________________________________   LABS (all labs ordered are listed, but only abnormal results are displayed)  Labs Reviewed  BASIC METABOLIC PANEL - Abnormal; Notable for the following components:      Result Value   Potassium 3.4 (*)    CO2 21 (*)    Glucose, Bld 107 (*)    All other components within normal limits  CBC  D-DIMER, QUANTITATIVE (NOT AT [facility])  I-STAT BETA HCG BLOOD, ED ([facility], [facility], [facility] ONLY)  TROPONIN I (HIGH SENSITIVITY)  TROPONIN I (HIGH SENSITIVITY)   ____________________________________________  EKG   EKG Interpretation  Date/Time:  Thursday August 13 2020 12:21:33 EST Ventricular Rate:  87 PR Interval:  142 QRS Duration: 84 QT Interval:  374 QTC Calculation: 450 R Axis:   81 Text Interpretation: Normal sinus rhythm Normal ECG No STEMI Confirmed by Nanda Quinton [phone]) on 08/13/2020 4:15:09 PM      EKG 2:    EKG Interpretation  Date/Time:  Thursday August 13 2020 17:31:15 EST Ventricular Rate:  93 PR Interval:  146 QRS Duration: 84 QT Interval:  380 QTC Calculation: 472 R Axis:   81 Text Interpretation: Normal sinus rhythm No significant change since last tracing No STEMI Confirmed by Nanda Quinton [phone]) on 08/13/2020 5:40:50 PM       ____________________________________________  RADIOLOGY  DG Chest 2 View  Result Date: 08/13/2020 CLINICAL DATA:  Cardiac palpitations EXAM: CHEST - 2 VIEW COMPARISON:  September 07, 2018 FINDINGS: The lungs are clear. Heart size  and pulmonary vascularity are normal. No adenopathy. There is mild lower thoracic levoscoliosis. No pneumothorax. IMPRESSION: Lungs clear.  Cardiac silhouette normal. Electronically Signed   By: Lowella Grip III M.D.   On: 08/13/2020 13:02    ____________________________________________   PROCEDURES  Procedure(s) performed:   Procedures  None ____________________________________________   INITIAL IMPRESSION / ASSESSMENT AND PLAN / ED COURSE  Pertinent labs & imaging results that were available during my care of the patient were reviewed by me and considered in my medical decision making (see chart for details).   Patient presents to the emergency department with heart palpitations and near syncope today.  She has had intermittent symptoms like this in the past with no clear diagnosis.  EKG here is reassuring.  No hypotension or continued symptoms.  Patient is occasionally having a skipped beat sensation here but nothing persistent like before.  Very  mild hypokalemia on labs at 3.4.  Plan for delta troponin, IV fluids.  Patient may ultimately require ambulatory monitoring as an outpatient once again to secure diagnosis.  Symptoms seem to have been present prior to starting the Lexapro yesterday and so doubt medication reaction at this time.   05:25 PM Made aware by NT that patient is experiencing some CP when he went back to draw the second troponin. Will follow this lab and have repeated EKG which is unchanged. Have added d-dimer as well.   D-dimer negative. EKG unchanged. Plan for close PCP and Cardiology follow up PRN. Discussed ED return precautions.  ____________________________________________  FINAL CLINICAL IMPRESSION(S) / ED DIAGNOSES  Final diagnoses:  Palpitations  Precordial chest pain     MEDICATIONS GIVEN DURING THIS VISIT:  Medications  lactated ringers bolus 1,000 mL (0 mLs Intravenous Stopped 08/13/20 2104)     Note:  This document was prepared using  Dragon voice recognition software and may include unintentional dictation errors.  Nanda Quinton, MD, [facility] Emergency Medicine    Neala Miggins, [facility], MD 08/16/20 2010

## 2020-08-13 NOTE — Discharge Instructions (Signed)
You were seen in the emerge department today with feeling of irregular heartbeats and lightheadedness. Your lab work here is largely reassuring along with chest x-ray and EKGs. I would like for you drink plenty of fluids and get some rest. Please continue your medications started yesterday by your primary care doctor. I would like for you to call your PCP on Monday to see if you would benefit from additional ambulatory heart monitoring to see if you are going into an abnormal heart rhythm at times. If your symptoms significantly worsen, you develop worsening chest pain, passing out, you should return to the emergency department immediately and/or call 911.

## 2020-08-13 NOTE — ED Notes (Signed)
Patient c/o chest pain. RN and MD notified.

## 2020-08-13 NOTE — ED Triage Notes (Signed)
Pt bib ems from work for sudden onset of palpitations and dizziness. Pt recently started on lexapro yesterday for anxiety. Pt a.o, nad noted

## 2020-08-18 NOTE — Telephone Encounter (Signed)
Please arrange video visit with me

## 2020-08-18 NOTE — Telephone Encounter (Signed)
Pt is calling in stating that today she is still having the anxiety attacks and mild panics all day today and would like to see if something can be done about it.

## 2020-08-18 NOTE — Telephone Encounter (Signed)
Needs visit with me to discuss Unless Dr. Caryl Never who prescribed the Lexapro initially has opinion otherwise. I have reviewed the emergency room visit. Video visit is okay should have openings on Thursday morning

## 2020-08-19 NOTE — Telephone Encounter (Signed)
There are no appt available  For this patient on Dr Fabian Sharp schedule . Would like to where to schedule apt

## 2020-08-24 NOTE — Telephone Encounter (Signed)
Unfortunately I never got nor did not see this message until today. Please contact patient about how she is doing

## 2020-08-26 NOTE — Telephone Encounter (Signed)
Patient called and advised of Dr. Rosezella Florida notes below and to follow up in the office or virtual to discuss issues with anxiety. Patient agrees to virtual appointment. First available for patient's work schedule is Tuesday, 09/01/20 at 1330 via MyChart Video with Dr. Fabian Sharp.

## 2020-09-01 ENCOUNTER — Telehealth (INDEPENDENT_AMBULATORY_CARE_PROVIDER_SITE_OTHER): Payer: BC Managed Care – PPO | Admitting: Internal Medicine

## 2020-09-01 ENCOUNTER — Encounter: Payer: Self-pay | Admitting: Internal Medicine

## 2020-09-01 VITALS — Wt 125.0 lb

## 2020-09-01 DIAGNOSIS — R002 Palpitations: Secondary | ICD-10-CM

## 2020-09-01 DIAGNOSIS — F419 Anxiety disorder, unspecified: Secondary | ICD-10-CM | POA: Diagnosis not present

## 2020-09-01 DIAGNOSIS — Z79899 Other long term (current) drug therapy: Secondary | ICD-10-CM | POA: Diagnosis not present

## 2020-09-01 DIAGNOSIS — I493 Ventricular premature depolarization: Secondary | ICD-10-CM

## 2020-09-01 DIAGNOSIS — Z8249 Family history of ischemic heart disease and other diseases of the circulatory system: Secondary | ICD-10-CM

## 2020-09-01 NOTE — Progress Notes (Signed)
Virtual Visit via Video Note  I connected with@ on 09/01/20 at  1:30 PM EST by a video enabled telemedicine application and verified that I am speaking with the correct person using two identifiers. Location patient: home Location provider:work  office Persons participating in the virtual visit: patient, provider  [place] national recommendations  regarding COVID 19 pandemic   video visit is advised over in office visit for this patient.  Patient aware  of the limitations of evaluation and management by telemedicine and  availability of in person appointments. and agreed to proceed.   HPI: Michele Andersen presents for video visit problem with anxiety and palpitations. She was seen in [place] [facility]x 12 23 21  fore palpiaitions  Felt to be  Poss triggered by anxiety   She described it as heart skipping a beat and pausing and then feeling quite bad seen in ER after 911 felt faint but no syncope. Had negative ED evaluation including troponin and D-dimer given IV fluids. Had just started Lexapro.  EKG was normal blood pressure up in the ED but came down.  Chest x-ray normal. She is continued on Lexapro 10 mg and since that time has not had an anxiety attack.  Feels hopeful for this and feels that she is back to her baseline situation. Will get occasional PVC heart skipping but no exercise intolerance change.  She has had a Holter monitoring for 1 week and just had low burden PVCs. Getting more sleep. Immunization has had triple vacs for COVID last 1 December 4 No flu shot yet Household of 2 to cats ROS: See pertinent positives and negatives per HPI. Troponin d dimer  Neg fluids given  potassiium 3.4   Saw  Dr Elease Hashimoto x 2  In December   Family history mother with history of A. fib Sister has history of PE but no arrhythmias. Past Medical History:  Diagnosis Date  . Allergy   . Anemia   . Anxiety   . Depression   . Eczema   . PONV (postoperative nausea and vomiting)   . Submucous myoma of  uterus   . 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 DILATATION & CURETTAGE/HYSTEROSCOPY WITH MYOSURE N/A 05/22/2020   Procedure: [place];  Surgeon: Azucena Fallen, MD;  Location: [facility];  Service: Gynecology;  Laterality: N/A;  . right arm surgery     right arm x 1 2017 right wrist x 1 11-2019 surgical center of     Family History  Problem Relation Age of Onset  . Hyperlipidemia Mother   . Diabetes Father   . Hyperlipidemia Father     Social History   Tobacco Use  . Smoking status: Former Smoker    Packs/day: 0.50    Years: 4.00    Pack years: 2.00    Types: Cigarettes    Quit date: 08/22/2002    Years since quitting: 18.0  . Smokeless tobacco: Never Used  Vaping Use  . Vaping Use: Never used  Substance Use Topics  . Alcohol use: Yes    Comment: occ   . Drug use: No      Current Outpatient Medications:  .  cetirizine (ZYRTEC) 10 MG tablet, Take 10 mg by mouth daily., Disp: , Rfl:  .  escitalopram (LEXAPRO) 10 MG tablet, Take 1 tablet (10 mg total) by mouth daily. (Patient taking differently: Take 10 mg by mouth at bedtime.), Disp: 30 tablet,  Rfl: 5 .  Ferrous Sulfate (SLOW FE PO), Take 1 tablet by mouth daily., Disp: , Rfl:  .  Multiple Vitamins-Minerals (MULTIVITAMIN WITH MINERALS) tablet, Take 1 tablet by mouth daily., Disp: , Rfl:   EXAM: BP Readings from Last 3 Encounters:  08/13/20 123/79  08/12/20 120/76  05/22/20 127/68    VITALS per patient if applicable:  GENERAL: alert, oriented, appears well and in no acute distress  HEENT: atraumatic, conjunttiva clear, no obvious abnormalities on inspection of external nose and ears  NECK: normal movements of the head and neck  LUNGS: on inspection no signs of respiratory distress, breathing rate appears normal, no obvious gross SOB, gasping or wheezing  CV: no obvious cyanosis  MS: moves  all visible extremities without noticeable abnormality  PSYCH/NEURO: pleasant and cooperative, no obvious depression or anxiety, speech and thought processing grossly intact Lab Results  Component Value Date   WBC 8.8 08/13/2020   HGB 14.5 08/13/2020   HCT 41.0 08/13/2020   PLT 319 08/13/2020   GLUCOSE 107 (H) 08/13/2020   CHOL 169 08/06/2010   TRIG 124.0 08/06/2010   HDL 34.10 (L) 08/06/2010   LDLCALC 110 (H) 08/06/2010   ALT 10 08/06/2010   AST 11 08/06/2010   NA 138 08/13/2020   K 3.4 (L) 08/13/2020   CL 104 08/13/2020   CREATININE 0.53 08/13/2020   BUN 7 08/13/2020   CO2 21 (L) 08/13/2020   TSH 1.46 09/13/2019   Record review including 2 office visits and ED visit. ASSESSMENT AND PLAN:  Discussed the following assessment and plan:    ICD-10-CM   1. Palpitations  R00.2   2. Anxiety-like symptoms  F41.9   3. Medication management  Z79.899   4. Family history of atrial fibrillation  Z82.49    mom  5. PVC's (premature ventricular contractions)  I49.3    appear benign based on hx and context     Counseled.  Significant improvement since been to plus weeks on Lexapro.  Patient and we agree this acts like anxiety attacks with context.  And no alarm findings or symptoms.  I E no exercise intolerance other situation. At this time do not think need to order echocardiogram for reasons discussed. Look into additional counseling in addition to the medication continuation CBT type support Plan R OV in about a month.  For med check and evaluation let us know in the meantime if there are concerns.   Expectant management and discussion of plan and treatment with opportunity to ask questions and all were answered. The patient agreed with the plan and demonstrated an understanding of the instructions.   Advised to call back or seek an in-person evaluation if worsening  or having  further concerns . Return in about 1 month (around 10/02/2020) for Med evaluation virtual okay.  Shanon Ace, MD

## 2020-09-06 ENCOUNTER — Other Ambulatory Visit: Payer: Self-pay | Admitting: Family Medicine

## 2020-09-08 ENCOUNTER — Other Ambulatory Visit: Payer: Self-pay

## 2020-09-08 MED ORDER — ESCITALOPRAM OXALATE 10 MG PO TABS: 10.0000 mg | ORAL_TABLET | Freq: Every day | ORAL | 3 refills | Status: DC

## 2020-10-14 ENCOUNTER — Other Ambulatory Visit: Payer: Self-pay

## 2020-10-14 ENCOUNTER — Telehealth: Payer: Self-pay | Admitting: Internal Medicine

## 2020-10-14 MED ORDER — ESCITALOPRAM OXALATE 10 MG PO TABS: 10.0000 mg | ORAL_TABLET | Freq: Every day | ORAL | 0 refills | Status: DC

## 2020-10-14 NOTE — Telephone Encounter (Signed)
Lexapro 10mg  90 day supply sent to [facility] [address]

## 2020-10-14 NOTE — Telephone Encounter (Signed)
Pt is calling in stating that her insurance is asking that she get all of her medication changed to a #90 instead of 30 b/c they will no longer cover them.  Pt is out of Rx escitalopram (LEXAPRO) 10 MG and would like to see if it can be called in today.  Pharm: [facility] [address].

## 2021-01-04 ENCOUNTER — Other Ambulatory Visit: Payer: Self-pay | Admitting: Internal Medicine

## 2021-04-14 ENCOUNTER — Other Ambulatory Visit: Payer: Self-pay | Admitting: Internal Medicine

## 2021-07-21 ENCOUNTER — Other Ambulatory Visit: Payer: Self-pay | Admitting: Internal Medicine

## 2021-08-04 ENCOUNTER — Telehealth: Payer: Self-pay | Admitting: Internal Medicine

## 2021-08-04 ENCOUNTER — Other Ambulatory Visit: Payer: Self-pay | Admitting: Internal Medicine

## 2021-08-04 NOTE — Telephone Encounter (Signed)
Pt is calling and has made an appt for 08-30-2021 and would a refill escitalopram (LEXAPRO) 10 MG tablet send to   [id number] [place] [place] - [address] Phone:  [phone]  Fax:  [phone]

## 2021-08-04 NOTE — Telephone Encounter (Signed)
Rx sent 

## 2021-08-19 ENCOUNTER — Telehealth: Payer: BC Managed Care – PPO

## 2021-08-19 ENCOUNTER — Encounter: Payer: Self-pay | Admitting: Internal Medicine

## 2021-08-19 ENCOUNTER — Telehealth: Payer: BC Managed Care – PPO | Admitting: Physician Assistant

## 2021-08-19 DIAGNOSIS — U071 COVID-19: Secondary | ICD-10-CM

## 2021-08-19 MED ORDER — BENZONATATE 100 MG PO CAPS: 100.0000 mg | ORAL_CAPSULE | Freq: Three times a day (TID) | ORAL | 0 refills | Status: DC | PRN

## 2021-08-19 NOTE — Progress Notes (Signed)
I have spent 5 minutes in review of e-visit questionnaire, review and updating patient chart, medical decision making and response to patient.   Zamari Vea Cody Iverson Sees, PA-C    

## 2021-08-19 NOTE — Progress Notes (Signed)
E-Visit  for Positive Covid Test Result  We are sorry you are not feeling well. We are here to help!  You have tested positive for COVID-19, meaning that you were infected with the novel coronavirus and could give the virus to others.  It is vitally important that you stay home so you do not spread it to others.      Please continue isolation at home, for at least 10 days since the start of your symptoms and until you have had 24 hours with no fever (without taking a fever reducer) and with improving of symptoms.  If you have no symptoms but tested positive (or all symptoms resolve after 5 days and you have no fever) you can leave your house but continue to wear a mask around others for an additional 5 days. If you have a fever,continue to stay home until you have had 24 hours of no fever. Most cases improve 5-10 days from onset but we have seen a small number of patients who have gotten worse after the 10 days.  Please be sure to watch for worsening symptoms and remain taking the proper precautions.   Go to the nearest hospital ED for assessment if fever/cough/breathlessness are severe or illness seems like a threat to life.    The following symptoms may appear 2-14 days after exposure: Fever Cough Shortness of breath or difficulty breathing Chills Repeated shaking with chills Muscle pain Headache Sore throat New loss of taste or smell Fatigue Congestion or runny nose Nausea or vomiting Diarrhea  You have been enrolled in [facility] for COVID-19. Daily you will receive a questionnaire within the [facility] website. Our COVID-19 response team will be monitoring your responses daily.  You can use medication such as prescription cough medication called Tessalon Perles 100 mg. You may take 1-2 capsules every 8 hours as needed for cough  You may also take acetaminophen (Tylenol) as needed for fever.  I can provide a work note for today and tomorrow if needed but we cannot  retroactively date a work note through this avenue, other than to say when COVID symptoms started and what typical quarantine for [place] is. We do not fill out paperwork (FMLA, etc) so if that is needed you would need to discuss with your PCP.   HOME CARE: Only take medications as instructed by your medical team. Drink plenty of fluids and get plenty of rest. A steam or ultrasonic humidifier can help if you have congestion.   GET HELP RIGHT AWAY IF YOU HAVE EMERGENCY WARNING SIGNS.  Call 911 or proceed to your closest emergency facility if: You develop worsening high fever. Trouble breathing Bluish lips or face Persistent pain or pressure in the chest New confusion Inability to wake or stay awake You cough up blood. Your symptoms become more severe Inability to hold down food or fluids  This list is not all possible symptoms. Contact your medical provider for any symptoms that are severe or concerning to you.    Your e-visit answers were reviewed by a board certified advanced clinical practitioner to complete your personal care plan.  Depending on the condition, your plan could have included both over the counter or prescription medications.  If there is a problem please reply once you have received a response from your provider.  Your safety is important to [place].  If you have drug allergies check your prescription carefully.    You can use MyChart to ask questions about today's  visit, request a non-urgent call back, or ask for a work or school excuse for 24 hours related to this e-Visit. If it has been greater than 24 hours you will need to follow up with your provider, or enter a new e-Visit to address those concerns. You will get an e-mail in the next two days asking about your experience.  I hope that your e-visit has been valuable and will speed your recovery. Thank you for using e-visits.

## 2021-08-20 ENCOUNTER — Telehealth: Payer: Self-pay

## 2021-08-20 ENCOUNTER — Telehealth: Payer: Self-pay | Admitting: Internal Medicine

## 2021-08-20 NOTE — Telephone Encounter (Addendum)
Patient calling in with respiratory symptoms: Shortness of breath, chest pain, palpitations or other red words send to Triage  Does the patient have a fever over 100, cough, congestion, sore throat, runny nose, lost of taste/smell (please list symptoms that patient has)cough,brain fog, fatigue,achiness and sinus congestion  What date did symptoms start?08-15-2021 (If over 5 days ago, pt may be scheduled for in person visit)  Have you tested for Covid in the last 5 days? Yes   If yes, was it positive []  OR negative [] ? If positive in the last 5 days, please schedule virtual visit now. If negative, schedule for an in person OV with the next available provider if PCP has no openings. Please also let patient know they will be tested again (follow the script below)  "you will have to arrive 66mins prior to your appt time to be Covid tested. Please park in back of office at the cone & call [phone] to let the staff know you have arrived. A staff member will meet you at your car to do a rapid covid test. Once the test has resulted you will be notified by phone of your results to determine if appt will remain an in person visit or be converted to a virtual/phone visit. If you arrive less than 22mins before your appt time, your visit will be automatically converted to virtual & any recommended testing will happen AFTER the visit."  Pt tested positive for covid on 08-15-2021 and pt has an appt with dr Regis Bill on 08-24-2021 THINGS TO REMEMBER  If no availability for virtual visit in office,  please schedule another [place] office  If no availability at another [place] office, please instruct patient that they can schedule an evisit or virtual visit through their mychart account. Visits up to 8pm  patients can be seen in office 5 days after positive COVID test

## 2021-08-20 NOTE — Telephone Encounter (Signed)
Left voicemail for patient to call the office 

## 2021-08-20 NOTE — Telephone Encounter (Signed)
Visit  with provider required to do fmla forms .  You should be able to talk with your employer  about delay in paper work .  Can drop off paper work and make appt to review at a visit .

## 2021-08-24 ENCOUNTER — Ambulatory Visit (INDEPENDENT_AMBULATORY_CARE_PROVIDER_SITE_OTHER): Payer: BC Managed Care – PPO | Admitting: Internal Medicine

## 2021-08-24 VITALS — BP 98/70 | HR 81 | Temp 98.5°F | Wt 138.2 lb

## 2021-08-24 DIAGNOSIS — U071 COVID-19: Secondary | ICD-10-CM

## 2021-08-24 DIAGNOSIS — J988 Other specified respiratory disorders: Secondary | ICD-10-CM | POA: Diagnosis not present

## 2021-08-24 DIAGNOSIS — H6982 Other specified disorders of Eustachian tube, left ear: Secondary | ICD-10-CM | POA: Diagnosis not present

## 2021-08-24 DIAGNOSIS — Z79899 Other long term (current) drug therapy: Secondary | ICD-10-CM

## 2021-08-24 NOTE — Patient Instructions (Signed)
Will get the fmla paperwork completed  for RTW poss mon Jan 9 if neg testing x 2  as per work protocol. Fatigue and cough may last a while.  NO obvious bacterial infection   if getting more localizing pain let us know consider  antibiotic  rx .   Healthy eating rest  . Hydration advised .

## 2021-08-24 NOTE — Progress Notes (Signed)
Chief Complaint  Patient presents with   Fatigue    Post COVID sinus, congestion, fatigue, some cough and achyness    Ear Fullness    Feels full and stuffy, a little pain every now and then    HPI: Laxmi Choung 46 y.o. come in for FU Covid 19 resp infection  because has  fmla forms .  Was seen  by other provider  last week  and they dont do fmla forms . Onset the evening of December 24 of malaise fever rash yes symptoms congestion.  Had fever then for the next 2 to 3 days.  Upper respiratory congestion some cough but no shortness of breath significant fatigue and malaise. Had any visit.  Since then has had some left ear pain feeling like pressure in addition to her sinus congestion nonspecific tenderness no shortness of breath. She return to work on December 20 ACE although was tired and was told to go home on the 29th because of her diagnosis of COVID infection.  Was told she needed FMLA forms and to be out until all symptoms were gone and testing was negative. She works at [employer] is left ear. In regard to medication check due her anxiety is better not perfect but medicine Lexapro has been helpful.  She had a follow-up visit planned next week for med check. ROS: See pertinent positives and negatives per HPI.  No fever loss of taste and smell.  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    Family History  Problem Relation Age of Onset   Hyperlipidemia Mother    Diabetes Father    Hyperlipidemia Father     Social History   Socioeconomic History   Marital status: Married    Spouse name: Not on file   Number of children: Not on file   Years of education: Not on file   Highest education level: Bachelor's degree (e.g., BA, AB, BS)  Occupational History   Not on file  Tobacco Use   Smoking  status: Former    Packs/day: 0.50    Years: 4.00    Pack years: 2.00    Types: Cigarettes    Quit date: 08/22/2002    Years since quitting: 19.0   Smokeless tobacco: Never  Vaping Use   Vaping Use: Never used  Substance and Sexual Activity   Alcohol use: Yes    Comment: occ    Drug use: No   Sexual activity: Not on file  Other Topics Concern   Not on file  Social History Narrative   Not on file   Social Determinants of Health   Financial Resource Strain: Low Risk    Difficulty of Paying Living Expenses: Not hard at all  Food Insecurity: No Food Insecurity   Worried About Charity fundraiser in the Last Year: Never true   Ran Out of Food in the Last Year: Never true  Transportation Needs: No Transportation Needs   Lack of Transportation (Medical): No   Lack of Transportation (Non-Medical): No  Physical Activity: Insufficiently Active   Days of Exercise per Week: 3 days   Minutes of Exercise per Session: 20 min  Stress: Stress Concern Present   Feeling of Stress : To some extent  Social Connections: Moderately Isolated   Frequency of Communication with Friends and Family: Once a week   Frequency of Social Gatherings with Friends and Family: Once a week   Attends Religious Services: Never   Marine scientist or Organizations: Yes   Attends Music therapist: 1 to 4 times per year   Marital Status: Married    Outpatient Medications Prior to Visit  Medication Sig Dispense Refill   benzonatate (TESSALON) 100 MG capsule Take 1 capsule (100 mg total) by mouth 3 (three) times daily as needed for cough. 30 capsule 0   cetirizine (ZYRTEC) 10 MG tablet Take 10 mg by mouth daily.     escitalopram (LEXAPRO) 10 MG tablet TAKE 1 TABLET BY MOUTH EVERY DAY 90 tablet 0   Multiple Vitamins-Minerals (MULTIVITAMIN WITH MINERALS) tablet Take 1 tablet by mouth daily.     Ferrous Sulfate (SLOW FE PO) Take 1 tablet by mouth daily.     No facility-administered medications  prior to visit.     EXAM:  BP 98/70 (BP Location: Right Arm, Patient Position: Sitting, Cuff Size: Normal)    Pulse 81    Temp 98.5 F (36.9 C) (Oral)    Wt 138 lb 3.2 oz (62.7 kg)    SpO2 99%    BMI 24.48 kg/m   Body mass index is 24.48 kg/m.  GENERAL: vitals reviewed and listed above, alert, oriented, appears well hydrated and in no acute distress congested nontoxic no dyspnea occasional cough a bit tired. HEENT: atraumatic, conjunctiva  clear, no obvious abnormalities on inspection of external nose and ears TMs are clear left is shiny bony landmarks intact OP Mast not checked NECK: no obvious masses on inspection palpation  LUNGS: clear to auscultation bilaterally, no wheezes, rales or rhonchi, good air movement CV: HRRR, no clubbing cyanosis or  peripheral edema nl cap refill  MS: moves all extremities without noticeable focal  abnormality PSYCH: pleasant and cooperative, no obvious depression or anxiety  BP Readings from Last 3 Encounters:  08/24/21 98/70  08/13/20 123/79  08/12/20 120/76   Record review  FMLA completed ans signed   ASSESSMENT AND PLAN:  Discussed the following assessment and plan:  Respiratory tract infection due to COVID-19 virus - last covid bvooster november 2022  Medication management - cont med fu in a month to reassess after covid infection  Dysfunction of left eustachian tube Left ear fullness could be eustachian tube dysfunction at this time no evidence of bacterial infection although if progressive consider empiric treatment.  In regard to [facility] form will complete as best possible she is at the 10-day mark but may continue to have some symptoms for a while.  Fortunately no complication. Based on her workplace she can do home test repeat in 2 days and if negative go back to work if they accept that protocol.  In regard to the Lexapro continue medicine and change her med check appointment to the end of the month.  Can be virtual. -Patient advised  to return or notify health care team  if  new concerns arise.  Patient Instructions  Will get the fmla paperwork completed  for RTW poss mon Jan 9 if neg testing x 2  as per work protocol. Fatigue and cough may last a while.  NO obvious bacterial infection   if getting more localizing pain let us know consider  antibiotic  rx .   Healthy eating rest  . Hydration advised .     Mariann Laster  KRegis Bill M.D.

## 2021-08-25 NOTE — Telephone Encounter (Signed)
Left patient a voicemail letting her know the forms are ready to be picked up.

## 2021-08-26 ENCOUNTER — Telehealth: Payer: Self-pay | Admitting: Internal Medicine

## 2021-08-26 NOTE — Telephone Encounter (Signed)
Patient called in stating that she was returning our call from yesterday (08/25/2021) however the only call I see documented is from [facility] on the 30th.    Patient would like a call back.Michele KitchenMarland Andersen

## 2021-08-30 ENCOUNTER — Ambulatory Visit: Payer: BC Managed Care – PPO | Admitting: Internal Medicine

## 2021-09-13 ENCOUNTER — Telehealth (INDEPENDENT_AMBULATORY_CARE_PROVIDER_SITE_OTHER): Payer: BC Managed Care – PPO | Admitting: Internal Medicine

## 2021-09-13 ENCOUNTER — Telehealth: Payer: Self-pay | Admitting: Internal Medicine

## 2021-09-13 ENCOUNTER — Encounter: Payer: Self-pay | Admitting: Internal Medicine

## 2021-09-13 DIAGNOSIS — F419 Anxiety disorder, unspecified: Secondary | ICD-10-CM

## 2021-09-13 DIAGNOSIS — Z79899 Other long term (current) drug therapy: Secondary | ICD-10-CM | POA: Diagnosis not present

## 2021-09-13 DIAGNOSIS — U071 COVID-19: Secondary | ICD-10-CM

## 2021-09-13 DIAGNOSIS — R635 Abnormal weight gain: Secondary | ICD-10-CM | POA: Diagnosis not present

## 2021-09-13 DIAGNOSIS — J988 Other specified respiratory disorders: Secondary | ICD-10-CM

## 2021-09-13 NOTE — Telephone Encounter (Signed)
Patient dropped off paperwork that she says her and Dr. Regis Bill discussed.   She is wanting a call at [phone] once paperwork is completed.  Paperwork will be placed in folder.  Please advise.

## 2021-09-13 NOTE — Progress Notes (Signed)
Virtual Visit via Video Note  I connected with Michele Andersen on 09/13/21 at  8:30 AM EST by a video enabled telemedicine application and verified that I am speaking with the correct person using two identifiers. Location patient: home Location provider:work  office Persons participating in the virtual visit: patient, provider  WIth national recommendations  regarding COVID 19 pandemic   video visit is advised over in office visit for this patient.  Patient aware  of the limitations of evaluation and management by telemedicine and  availability of in person appointments. and agreed to proceed.   HPI: Michele Andersen presents for video visit follow-up of a number of issues  Convalescent COVID most symptoms have resolved minimal.  FMLA form needs to be adjusted for appropriate dates asked to bring a 5. Lexapro seems to help anxiety anxiety attacks but still has them previous had had some counseling through work but no more compensated sessions.  Some depression at times related to anxiety but reasonable control.  Lexapro does help.  Concerned about weight gain over the last year or so 10 pounds.  Had lost some weight with Noom app around 2020 paying attention wants to address whether could be related to medicine or other functions.  Periods are regular 7 hours plus of sleep no regular exercise but tries to get out at lunch and walk for 15 minutes no sleep apnea current weight is around 140  5 3.  Work is mostly inactive but not totally sedentary. Does have problems concentrating at times due for GYN physical no labs done for over a year.    ROS: See pertinent positives and negatives per HPI.  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    Past Surgical History:  Procedure Laterality Date   DILATATION &  CURETTAGE/HYSTEROSCOPY WITH MYOSURE N/A 05/22/2020   Procedure: [place];  Surgeon: Azucena Fallen, MD;  Location: [facility];  Service: Gynecology;  Laterality: N/A;   right arm surgery     right arm x 1 2017 right wrist x 1 11-2019 [facility] of [place]    Family History  Problem Relation Age of Onset   Hyperlipidemia Mother    Diabetes Father    Hyperlipidemia Father     Social History   Tobacco Use   Smoking status: Former    Packs/day: 0.50    Years: 4.00    Pack years: 2.00    Types: Cigarettes    Quit date: 08/22/2002    Years since quitting: 19.0   Smokeless tobacco: Never  Vaping Use   Vaping Use: Never used  Substance Use Topics   Alcohol use: Yes    Comment: occ    Drug use: No      Current Outpatient Medications:    benzonatate (TESSALON) 100 MG capsule, Take 1 capsule (100 mg total) by mouth 3 (three) times daily as needed for cough., Disp: 30 capsule, Rfl: 0   cetirizine (ZYRTEC) 10 MG tablet, Take 10 mg by mouth daily., Disp: , Rfl:    escitalopram (LEXAPRO) 10 MG tablet, TAKE 1 TABLET BY MOUTH EVERY DAY, Disp: 90 tablet, Rfl: 0   Multiple Vitamins-Minerals (MULTIVITAMIN WITH MINERALS) tablet, Take 1 tablet by mouth daily., Disp: , Rfl:  EXAM: BP Readings from Last 3 Encounters:  08/24/21 98/70  08/13/20 123/79  08/12/20 120/76   Wt Readings from Last 3 Encounters:  08/24/21 138 lb 3.2 oz (62.7 kg)  09/01/20 125 lb (56.7 kg)  08/13/20 125 lb (56.7 kg)    VITALS per patient if applicable:  GENERAL: alert, oriented, appears well and in no acute distress  HEENT: atraumatic, conjunttiva clear, no obvious abnormalities on inspection of external nose and ears  NECK: normal movements of the head and neck  LUNGS: on inspection no signs of respiratory distress, breathing rate appears normal, no obvious gross SOB, gasping or wheezing  CV: no obvious cyanosis  MS: moves all visible  extremities without noticeable abnormality  PSYCH/NEURO: pleasant and cooperative, no obvious depression or anxiety, speech and thought processing grossly intact Lab Results  Component Value Date   WBC 8.8 08/13/2020   HGB 14.5 08/13/2020   HCT 41.0 08/13/2020   PLT 319 08/13/2020   GLUCOSE 107 (H) 08/13/2020   CHOL 169 08/06/2010   TRIG 124.0 08/06/2010   HDL 34.10 (L) 08/06/2010   LDLCALC 110 (H) 08/06/2010   ALT 10 08/06/2010   AST 11 08/06/2010   NA 138 08/13/2020   K 3.4 (L) 08/13/2020   CL 104 08/13/2020   CREATININE 0.53 08/13/2020   BUN 7 08/13/2020   CO2 21 (L) 08/13/2020   TSH 1.46 09/13/2019    ASSESSMENT AND PLAN:  Discussed the following assessment and plan:    ICD-10-CM   1. Medication management  J00.938 Basic metabolic panel    CBC with Differential/Platelet    Hemoglobin A1c    Hepatic function panel    Lipid panel    TSH    T4, free    2. Weight gain  H82.9 Basic metabolic panel    CBC with Differential/Platelet    Hemoglobin A1c    Hepatic function panel    Lipid panel    TSH    T4, free    3. Anxiety-like symptoms  H37.1 Basic metabolic panel    CBC with Differential/Platelet    Hemoglobin A1c    Hepatic function panel    Lipid panel    TSH    T4, free    4. Respiratory tract infection due to COVID-19 virus  I96.7 Basic metabolic panel   E93.8 CBC with Differential/Platelet    Hemoglobin A1c    Hepatic function panel    Lipid panel    TSH    T4, free     Advise updated lab follow-up visit in 2 to 3 months can do as CPX readdress At this point address tracking symptoms increasing regular physical activity, avoid eating snacking in the evening. Medication could contribute but some many other factors involved over the last 2 years. Counseled.  May still benefit from counseling. At this point would not change the Lexapro or DC appears to benefit more than harm and may not be the main culprit of the weight concern but consider  revisit  in future fu   Expectant management and discussion of plan and treatment with opportunity to ask questions and all were answered. The patient agreed with the plan and demonstrated an understanding of the instructions.   Advised to call back or seek an in-person evaluation if worsening  or having  further concerns  in interim. Return for fasting labs  and then visit or cpx in 2-3 months .    Shanon Ace, MD

## 2021-09-14 NOTE — Telephone Encounter (Signed)
FMLA dates corrected with initial Form back to you for her to pick up.

## 2021-09-14 NOTE — Telephone Encounter (Signed)
Paperwork in red folder

## 2021-09-14 NOTE — Telephone Encounter (Signed)
Patient informed of paperwork ready for pick up

## 2021-10-31 ENCOUNTER — Other Ambulatory Visit: Payer: Self-pay | Admitting: Internal Medicine

## 2022-01-28 ENCOUNTER — Other Ambulatory Visit: Payer: Self-pay | Admitting: Internal Medicine

## 2022-02-03 ENCOUNTER — Telehealth: Payer: Self-pay | Admitting: Internal Medicine

## 2022-02-03 NOTE — Telephone Encounter (Signed)
Pt needs a refill of the escitalopram (LEXAPRO) 10 MG tablet but her new insurance wants to use  [facility], [address] AT [address] [place] Phone:  [phone]  Fax:  [phone]     Please send to this pharmacy.

## 2022-02-04 ENCOUNTER — Other Ambulatory Visit: Payer: Self-pay

## 2022-02-04 MED ORDER — ESCITALOPRAM OXALATE 10 MG PO TABS: 10.0000 mg | ORAL_TABLET | Freq: Every day | ORAL | 0 refills | Status: DC

## 2022-02-04 NOTE — Telephone Encounter (Signed)
Patient aware medication sent to [facility].

## 2022-03-09 LAB — HM MAMMOGRAPHY

## 2022-03-17 ENCOUNTER — Encounter: Payer: Self-pay | Admitting: Internal Medicine

## 2022-04-29 ENCOUNTER — Other Ambulatory Visit: Payer: Self-pay | Admitting: Internal Medicine

## 2022-05-02 ENCOUNTER — Telehealth: Payer: Self-pay | Admitting: Internal Medicine

## 2022-05-02 DIAGNOSIS — Z0000 Encounter for general adult medical examination without abnormal findings: Secondary | ICD-10-CM

## 2022-05-02 DIAGNOSIS — Z13220 Encounter for screening for lipoid disorders: Secondary | ICD-10-CM

## 2022-05-02 DIAGNOSIS — Z79899 Other long term (current) drug therapy: Secondary | ICD-10-CM

## 2022-05-02 NOTE — Telephone Encounter (Signed)
Requesting refill of escitalopram (LEXAPRO) 10 MG tablet

## 2022-05-04 NOTE — Addendum Note (Signed)
Addended byShanon Ace K on: 05/04/2022 05:18 PM   Modules accepted: Orders

## 2022-05-04 NOTE — Telephone Encounter (Signed)
Future orders for CPE placed

## 2022-05-05 ENCOUNTER — Other Ambulatory Visit (INDEPENDENT_AMBULATORY_CARE_PROVIDER_SITE_OTHER): Payer: 59

## 2022-05-05 DIAGNOSIS — Z0000 Encounter for general adult medical examination without abnormal findings: Secondary | ICD-10-CM | POA: Diagnosis not present

## 2022-05-05 DIAGNOSIS — Z13220 Encounter for screening for lipoid disorders: Secondary | ICD-10-CM

## 2022-05-05 DIAGNOSIS — Z79899 Other long term (current) drug therapy: Secondary | ICD-10-CM

## 2022-05-05 LAB — CBC WITH DIFFERENTIAL/PLATELET
Basophils Absolute: 0 10*3/uL (ref 0.0–0.1)
Basophils Relative: 0.5 % (ref 0.0–3.0)
Eosinophils Absolute: 0.2 10*3/uL (ref 0.0–0.7)
Eosinophils Relative: 2.1 % (ref 0.0–5.0)
HCT: 41.3 % (ref 36.0–46.0)
Hemoglobin: 14.1 g/dL (ref 12.0–15.0)
Lymphocytes Relative: 23.7 % (ref 12.0–46.0)
Lymphs Abs: 2.2 10*3/uL (ref 0.7–4.0)
MCHC: 34.3 g/dL (ref 30.0–36.0)
MCV: 88 fl (ref 78.0–100.0)
Monocytes Absolute: 0.7 10*3/uL (ref 0.1–1.0)
Monocytes Relative: 7.3 % (ref 3.0–12.0)
Neutro Abs: 6.3 10*3/uL (ref 1.4–7.7)
Neutrophils Relative %: 66.4 % (ref 43.0–77.0)
Platelets: 302 10*3/uL (ref 150.0–400.0)
RBC: 4.69 Mil/uL (ref 3.87–5.11)
RDW: 12.9 % (ref 11.5–15.5)
WBC: 9.4 10*3/uL (ref 4.0–10.5)

## 2022-05-05 LAB — BASIC METABOLIC PANEL
BUN: 12 mg/dL (ref 6–23)
CO2: 26 mEq/L (ref 19–32)
Calcium: 9 mg/dL (ref 8.4–10.5)
Chloride: 105 mEq/L (ref 96–112)
Creatinine, Ser: 0.65 mg/dL (ref 0.40–1.20)
GFR: 104.55 mL/min (ref >60.00)
Glucose, Bld: 90 mg/dL (ref 70–99)
Potassium: 4.1 mEq/L (ref 3.5–5.1)
Sodium: 139 mEq/L (ref 135–145)

## 2022-05-05 LAB — HEPATIC FUNCTION PANEL
ALT: 12 U/L (ref 0–35)
AST: 14 U/L (ref 0–37)
Albumin: 4.2 g/dL (ref 3.5–5.2)
Alkaline Phosphatase: 56 U/L (ref 39–117)
Bilirubin, Direct: 0.1 mg/dL (ref 0.0–0.3)
Total Bilirubin: 0.6 mg/dL (ref 0.2–1.2)
Total Protein: 7 g/dL (ref 6.0–8.3)

## 2022-05-05 LAB — LIPID PANEL
Cholesterol: 159 mg/dL (ref 0–200)
HDL: 42.7 mg/dL (ref >39.00)
LDL Cholesterol: 96 mg/dL (ref 0–99)
NonHDL: 116.14
Total CHOL/HDL Ratio: 4
Triglycerides: 101 mg/dL (ref 0.0–149.0)
VLDL: 20.2 mg/dL (ref 0.0–40.0)

## 2022-05-05 LAB — TSH: TSH: 2.12 u[IU]/mL (ref 0.35–5.50)

## 2022-05-08 NOTE — Progress Notes (Signed)
Levels in range   will review  at upcoming  appt  this week .

## 2022-05-11 ENCOUNTER — Encounter: Payer: Self-pay | Admitting: Internal Medicine

## 2022-05-11 ENCOUNTER — Ambulatory Visit (INDEPENDENT_AMBULATORY_CARE_PROVIDER_SITE_OTHER): Payer: 59 | Admitting: Internal Medicine

## 2022-05-11 VITALS — BP 132/80 | HR 62 | Temp 97.7°F | Ht 62.98 in | Wt 142.8 lb

## 2022-05-11 DIAGNOSIS — Z0000 Encounter for general adult medical examination without abnormal findings: Secondary | ICD-10-CM

## 2022-05-11 DIAGNOSIS — F419 Anxiety disorder, unspecified: Secondary | ICD-10-CM

## 2022-05-11 DIAGNOSIS — Z23 Encounter for immunization: Secondary | ICD-10-CM | POA: Diagnosis not present

## 2022-05-11 DIAGNOSIS — Z79899 Other long term (current) drug therapy: Secondary | ICD-10-CM

## 2022-05-11 MED ORDER — ESCITALOPRAM OXALATE 10 MG PO TABS: 10.0000 mg | ORAL_TABLET | Freq: Every day | ORAL | 2 refills | Status: DC

## 2022-05-11 NOTE — Progress Notes (Signed)
Chief Complaint  Patient presents with   Annual Exam    HPI: Patient  Michele Andersen  46 y.o. comes in today for [place] visit   Lexapro doing ok  still struggles some   helps .  Change  job in March.  No counseling . Doing better  GYne   fibroids.   Considering removing  to do ablation.   Health Maintenance  Topic Date Due   HIV Screening  Never done   Hepatitis C Screening  Never done   COLONOSCOPY (Pts 45-74yr Insurance coverage will need to be confirmed)  Never done   COVID-19 Vaccine (5 - Pfizer risk series) 09/04/2021   PAP SMEAR-Modifier  02/10/2022   TETANUS/TDAP  05/11/2032   INFLUENZA VACCINE  Completed   HPV VACCINES  Aged Out   Health Maintenance Review LIFESTYLE:  Exercise:  try at least 2-3 days per week  walking.  Tobacco/ETS: no Alcohol:  rare  Sugar beverages: 1-2 per week .  Sleep: average 7  Drug use: no HH of  2  2 cats  Work: 40  in person   ROS:  REST of 12 system review negative except as per HPI sometimes  dc concentrationfoggy but better since lexapro could she have add?   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    Past Surgical History:  Procedure Laterality Date   DILATATION & CURETTAGE/HYSTEROSCOPY WITH MYOSURE N/A 05/22/2020   [place]  Surgeon: MAzucena Fallen MD;  [facility]  Service: Gynecology;  Laterality: N/A;   right arm surgery     right arm x 1 2017 right wrist x 1 11-2019 surgical center of [place]    Family History  Problem Relation Age of Onset   Hyperlipidemia Mother    Diabetes Father    Hyperlipidemia Father     Social History   Socioeconomic History   Marital status: Married    Spouse name: Not on file   Number of children: Not on file    Years of education: Not on file   Highest education level: Bachelor's degree (e.g., BA, AB, BS)  Occupational History   Not on file  Tobacco Use   Smoking status: Former    Packs/day: 0.50    Years: 4.00    Total pack years: 2.00    Types: Cigarettes    Quit date: 08/22/2002    Years since quitting: 19.7   Smokeless tobacco: Never  Vaping Use   Vaping Use: Never used  Substance and Sexual Activity   Alcohol use: Yes    Comment: occ    Drug use: No   Sexual activity: Not on file  Other Topics Concern   Not on file  Social History Narrative   Not on file   Social Determinants of Health   Financial Resource Strain: Low Risk  (08/24/2021)   Overall Financial Resource Strain (CARDIA)    Difficulty of Paying Living Expenses: Not hard at all  Food Insecurity: No Food Insecurity (08/24/2021)   Hunger Vital Sign    Worried About Running Out of Food in the Last Year: Never true    Ran Out of Food in the Last Year: Never true  Transportation Needs:  No Transportation Needs (08/24/2021)   PRAPARE - Hydrologist (Medical): No    Lack of Transportation (Non-Medical): No  Physical Activity: Insufficiently Active (08/24/2021)   Exercise Vital Sign    Days of Exercise per Week: 3 days    Minutes of Exercise per Session: 20 min  Stress: Stress Concern Present (08/24/2021)   [place]    Feeling of Stress : To some extent  Social Connections: Moderately Isolated (08/24/2021)   Social Connection and Isolation Panel [NHANES]    Frequency of Communication with Friends and Family: Once a week    Frequency of Social Gatherings with Friends and Family: Once a week    Attends Religious Services: Never    Marine scientist or Organizations: Yes    Attends Archivist Meetings: 1 to 4 times per year    Marital Status: Married    Outpatient Medications Prior to Visit  Medication Sig Dispense  Refill   cetirizine (ZYRTEC) 10 MG tablet Take 10 mg by mouth daily.     Multiple Vitamins-Minerals (MULTIVITAMIN WITH MINERALS) tablet Take 1 tablet by mouth daily.     escitalopram (LEXAPRO) 10 MG tablet Take 1 tablet (10 mg total) by mouth daily. 90 tablet 0   benzonatate (TESSALON) 100 MG capsule Take 1 capsule (100 mg total) by mouth 3 (three) times daily as needed for cough. 30 capsule 0   No facility-administered medications prior to visit.     EXAM:  BP 132/80 (BP Location: Left Arm, Patient Position: Sitting, Cuff Size: Normal)   Pulse 62   Temp 97.7 F (36.5 C) (Oral)   Ht 5' 2.98" (1.6 m)   Wt 142 lb 12.8 oz (64.8 kg)   LMP 05/10/2022 (Exact Date)   SpO2 99%   BMI 25.31 kg/m   Body mass index is 25.31 kg/m. Wt Readings from Last 3 Encounters:  05/11/22 142 lb 12.8 oz (64.8 kg)  08/24/21 138 lb 3.2 oz (62.7 kg)  09/01/20 125 lb (56.7 kg)    Physical Exam: Vital signs reviewed GUY:QIHK is a well-developed well-nourished alert cooperative    who appearsr stated age in no acute distress.  HEENT: normocephalic atraumatic , Eyes: PERRL EOM's full, conjunctiva clear, Nares: paten,t no deformity discharge or tenderness., Ears: no deformity EAC's clear TMs with normal landmarks. Mouth: clear OP, no lesions, edema.  Moist mucous membranes. Dentition in adequate repair. NECK: supple without masses, thyromegaly or bruits. CHEST/PULM:  Clear to auscultation and percussion breath sounds equal no wheeze , rales or rhonchi. No chest wall deformities or tenderness. Breast: normal by inspection . No dimpling, discharge, masses, tenderness or discharge . CV: PMI is nondisplaced, S1 S2 no gallops, murmurs, rubs. Peripheral pulses are full without delay.No JVD .  ABDOMEN: Bowel sounds normal nontender  No guard or rebound, no hepato splenomegal no CVA tenderness.  No hernia. Extremtities:  No clubbing cyanosis or edema, no acute joint swelling or redness no focal atrophy NEURO:   Oriented x3, cranial nerves 3-12 appear to be intact, no obvious focal weakness,gait within normal limits no abnormal reflexes or asymmetrical SKIN: No acute rashes normal turgor, color, no bruising or petechiae. tatoos PSYCH: Oriented, good eye contact, no obvious depression anxiety, cognition and judgment appear normal. LN: no cervical axillary adenopathy  Lab Results  Component Value Date   WBC 9.4 05/05/2022   HGB 14.1 05/05/2022   HCT 41.3 05/05/2022   PLT 302.0  05/05/2022   GLUCOSE 90 05/05/2022   CHOL 159 05/05/2022   TRIG 101.0 05/05/2022   HDL 42.70 05/05/2022   LDLCALC 96 05/05/2022   ALT 12 05/05/2022   AST 14 05/05/2022   NA 139 05/05/2022   K 4.1 05/05/2022   CL 105 05/05/2022   CREATININE 0.65 05/05/2022   BUN 12 05/05/2022   CO2 26 05/05/2022   TSH 2.12 05/05/2022    BP Readings from Last 3 Encounters:  05/11/22 132/80  08/24/21 98/70  08/13/20 123/79    Lab results reviewed with patient   ASSESSMENT AND PLAN:  Discussed the following assessment and plan:    ICD-10-CM   1. Visit for preventive health examination  Z00.00     2. Medication management  Z79.899     3. Need for tetanus, diphtheria, and acellular pertussis (Tdap) vaccine  Z23 Tdap vaccine greater than or equal to 7yo IM    4. Influenza vaccine needed  Z23 Flu Vaccine QUAD 6+ mos PF IM (Fluarix Quad PF)    5. Anxiety-like symptoms  F41.9    improved stay on lexapro med check 4-6 mos    Disc techniques on attention and focus  effect of menopause and anxiety also . Follow  Return for 4-6 mos med check virtual of in person.  Patient Care Team: Burnis Medin, MD as PCP - General Patient Instructions  Good to see  you today . Exam is good.  Work on focusing as we discussed  If get stuck consider counseling. Otherwise plan  ROV med check in 4 -6 months  ( can b virtual or  in person) .   Standley Brooking. Amadi Frady M.D.

## 2022-05-11 NOTE — Patient Instructions (Addendum)
Good to see  you today . Exam is good.  Work on focusing as we discussed  If get stuck consider counseling. Otherwise plan  ROV med check in 4 -6 months  ( can b virtual or  in person) .

## 2022-05-18 IMAGING — DX DG CHEST 2V
2 series · 2 of 2 positions shown · non-contrast
Comparison: September 07, 2018

CLINICAL DATA: Cardiac palpitations

EXAM:
CHEST - 2 VIEW

[chest pa]
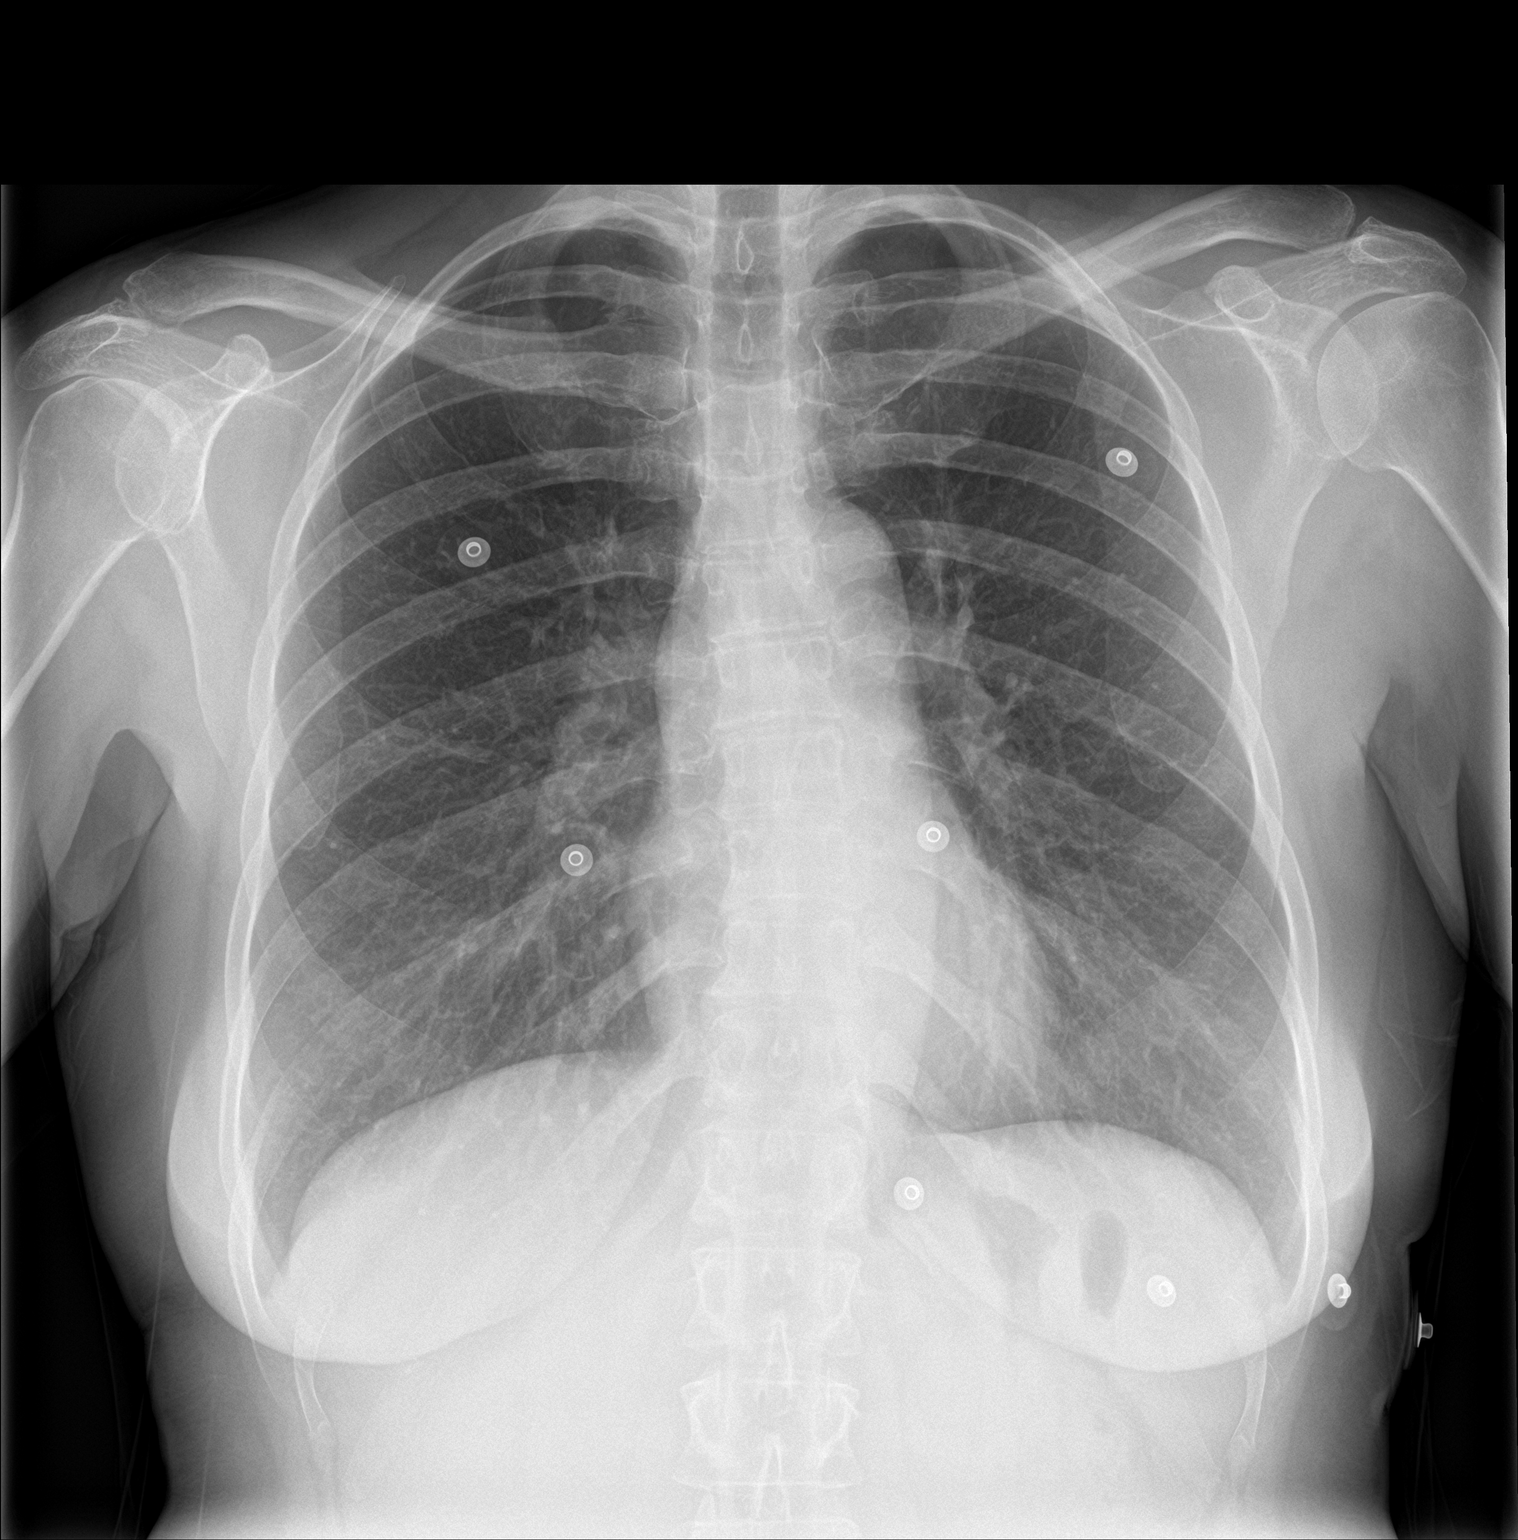

[chest lat]
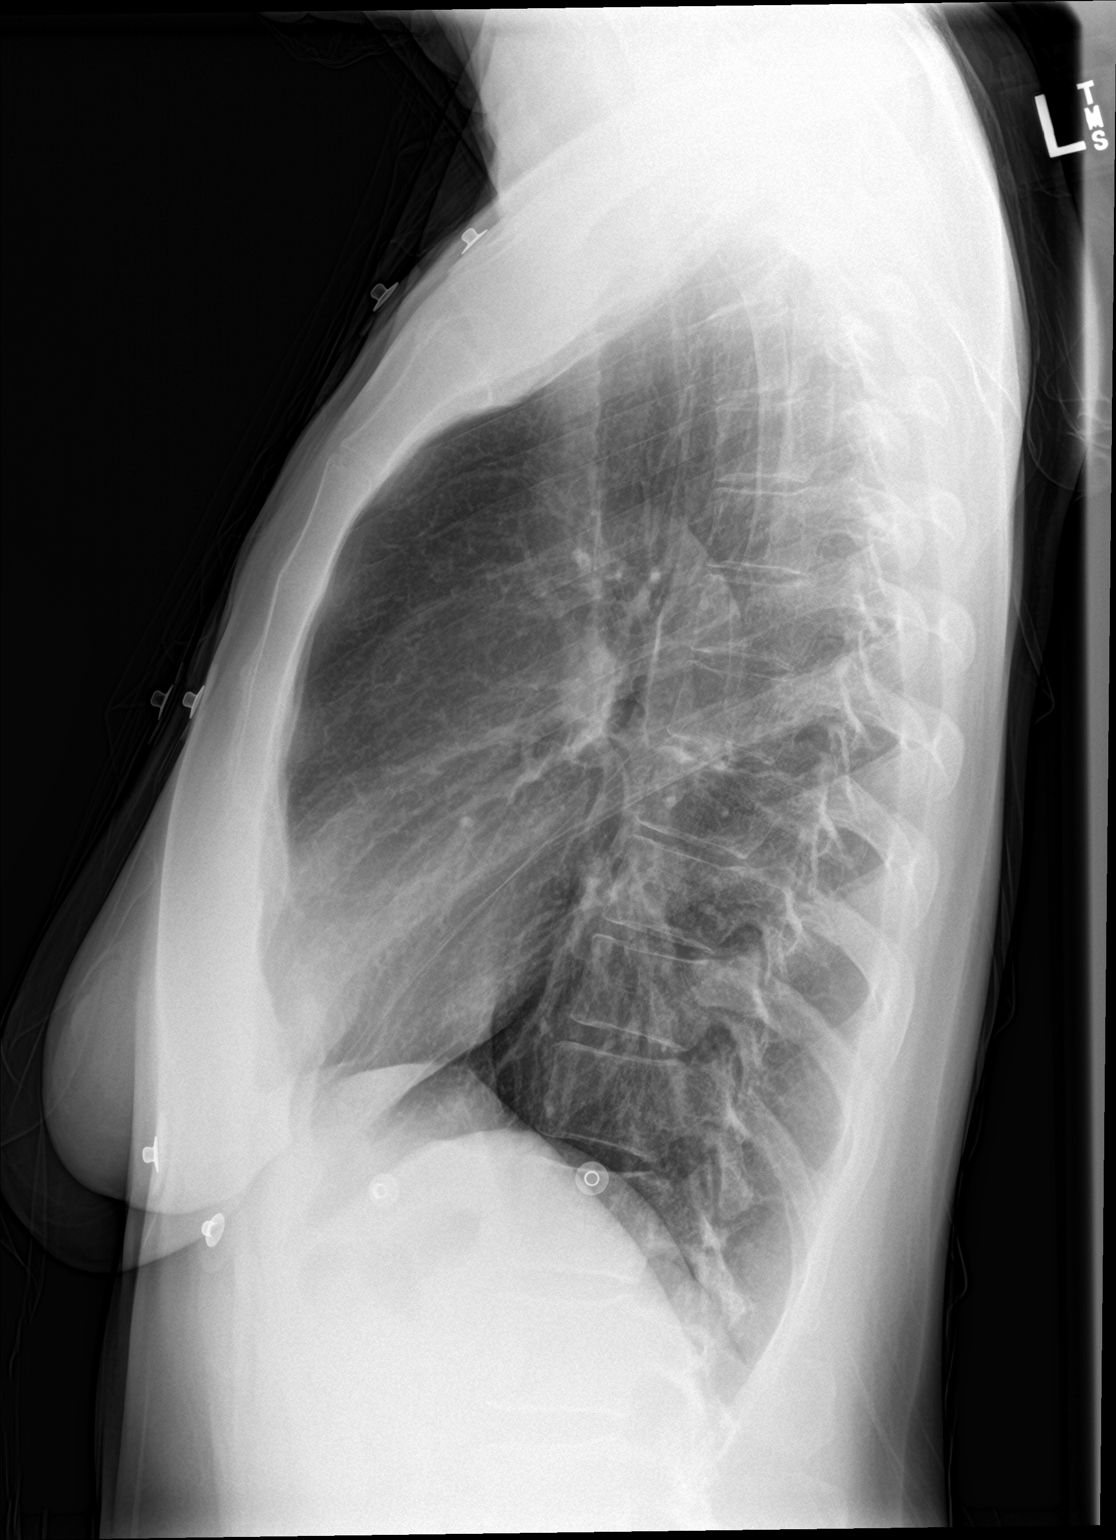

[2 of 2 positions shown; findings below may reference images not displayed]

FINDINGS: The lungs are clear. Heart size and pulmonary vascularity are
normal. No adenopathy. There is mild lower thoracic levoscoliosis.
No pneumothorax.
IMPRESSION: Lungs clear.  Cardiac silhouette normal.

## 2022-05-20 ENCOUNTER — Encounter: Payer: Self-pay | Admitting: Internal Medicine

## 2022-06-22 ENCOUNTER — Other Ambulatory Visit: Payer: Self-pay | Admitting: Obstetrics & Gynecology

## 2022-07-19 ENCOUNTER — Encounter (HOSPITAL_BASED_OUTPATIENT_CLINIC_OR_DEPARTMENT_OTHER): Payer: Self-pay | Admitting: Obstetrics & Gynecology

## 2022-07-20 NOTE — H&P (Signed)
Michele Andersen is an 46 y.o. female. Here for hysteroscopic myomectomy and HTA endometrial ablation.   46 yo female h/o uterine leiomyomas, h/o hysteroscopic resection in 2021, but has new onset pain/pressure with menses. Has night sweats off and on but menses are not skipping. No abn Pap hx and no famhx of uterus/ ovarian cancer .  Sister has PE hx on OCs  Nl paps   Pelvic ultrasound- uterus slightly enlarged, 8.6 x 4.8 x 4 cm, endometrial stripe is thickened at 10 mm, there is likely a 1.5 x 0.8 cm submucosal myoma, other fibroids noted intramural, all around 1-1.5 cm, Both ovaries are normal, no free fluid noted.  Sonohystogram - SIS-patient tolerated procedure well 3 submucosal myomas noted one is type 0 on anterior wall and other two are type 1, posterior wall. Each measuring 1.3 x1.2 cm, 1.8 x 1 cm, 1.2 x 0.6 cm. EMS thin     Patient's last menstrual period was 07/07/2022 (exact date).    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 Grandville Silos;    RIGHT EPICONDYLAR DEBRIDEMENT AND RADIAL NEUROPLASTY   WRIST GANGLION EXCISION Right 12/13/2019   '@[facility]'$    by   dr d. Grandville Silos;    AND RIGHT EPICONDYLAR DEBRIDEMENT W/ RADIAL NEUROPLASTY    Family History  Problem Relation Age of Onset   Hyperlipidemia Mother    Diabetes  Father    Hyperlipidemia Father     Social History:  reports that she quit smoking about 19 years ago. Her smoking use included cigarettes. She has a 2.00 pack-year smoking history. She has never used smokeless tobacco. She reports current alcohol use. She reports that she does not use drugs.  Allergies:  Allergies  Allergen Reactions   Thimerosal (Thiomersal) Itching    No medications prior to admission.    Review of Systems  Height '5\' 3"'$  (1.6 m), weight 63.5 kg, last menstrual period 07/07/2022. Physical Exam BP (!) 140/85   Pulse 66   Temp 98.3 F (36.8 C) (Oral)   Resp 15   Ht '5\' 3"'$  (1.6 m)   Wt 65.3 kg   LMP 07/07/2022 (Exact Date)   SpO2 99%   BMI 25.51 kg/m   A&O x 3, no acute distress. Pleasant HEENT neg, no thyromegaly Lungs CTA bilat CV RRR, S1S2 normal Abdo soft, non tender, non acute Extr no edema/ tenderness Pelvic  uterus bulky fibroids    No results found for this or any previous visit (from the past 24 hour(s)).  No results found.  Assessment/Plan: 46 yo female with SM myoma and menorrhagia.  She is here for hysteroscopic myomectomy and HTA endometrial ablation.   Risks/complications of surgery reviewed incl infection, bleeding, damage to internal organs including bladder, bowels, ureters, blood vessels, other risks  from anesthesia, VTE and delayed complications of any surgery, complications in future surgery reviewed. Pt understands and agrees, all concerns addressed.     Elveria Royals MD [place]

## 2022-07-21 ENCOUNTER — Other Ambulatory Visit: Payer: Self-pay

## 2022-07-21 ENCOUNTER — Encounter (HOSPITAL_BASED_OUTPATIENT_CLINIC_OR_DEPARTMENT_OTHER): Payer: Self-pay | Admitting: Obstetrics & Gynecology

## 2022-07-21 ENCOUNTER — Encounter (HOSPITAL_BASED_OUTPATIENT_CLINIC_OR_DEPARTMENT_OTHER)
Admission: RE | Disposition: A | Discharge status: Home / Self Care | Payer: Self-pay | Source: Home / Self Care | Attending: Obstetrics & Gynecology

## 2022-07-21 ENCOUNTER — Ambulatory Visit (HOSPITAL_BASED_OUTPATIENT_CLINIC_OR_DEPARTMENT_OTHER): Payer: 59 | Admitting: Anesthesiology

## 2022-07-21 ENCOUNTER — Ambulatory Visit (HOSPITAL_BASED_OUTPATIENT_CLINIC_OR_DEPARTMENT_OTHER)
Admission: RE | Admit: 2022-07-21 | Discharge: 2022-07-21 | Disposition: A | Discharge status: Home / Self Care | Payer: 59 | Attending: Obstetrics & Gynecology | Admitting: Obstetrics & Gynecology

## 2022-07-21 DIAGNOSIS — D250 Submucous leiomyoma of uterus: Secondary | ICD-10-CM | POA: Diagnosis not present

## 2022-07-21 DIAGNOSIS — N946 Dysmenorrhea, unspecified: Secondary | ICD-10-CM | POA: Insufficient documentation

## 2022-07-21 DIAGNOSIS — Z01818 Encounter for other preprocedural examination: Secondary | ICD-10-CM

## 2022-07-21 DIAGNOSIS — N920 Excessive and frequent menstruation with regular cycle: Secondary | ICD-10-CM | POA: Insufficient documentation

## 2022-07-21 DIAGNOSIS — Z87891 Personal history of nicotine dependence: Secondary | ICD-10-CM | POA: Insufficient documentation

## 2022-07-21 HISTORY — DX: Generalized anxiety disorder: F41.1

## 2022-07-21 HISTORY — PX: DILATATION & CURETTAGE/HYSTEROSCOPY WITH MYOSURE: SHX6511

## 2022-07-21 HISTORY — DX: Excessive and frequent menstruation with regular cycle: N92.0

## 2022-07-21 HISTORY — PX: DILITATION & CURRETTAGE/HYSTROSCOPY WITH HYDROTHERMAL ABLATION: SHX5570

## 2022-07-21 HISTORY — DX: Other seasonal allergic rhinitis: J30.2

## 2022-07-21 HISTORY — DX: Presence of spectacles and contact lenses: Z97.3

## 2022-07-21 LAB — CBC
HCT: 43.3 % (ref 36.0–46.0)
Hemoglobin: 14.3 g/dL (ref 12.0–15.0)
MCH: 29.6 pg (ref 26.0–34.0)
MCHC: 33 g/dL (ref 30.0–36.0)
MCV: 89.6 fL (ref 80.0–100.0)
Platelets: 348 10*3/uL (ref 150–400)
RBC: 4.83 MIL/uL (ref 3.87–5.11)
RDW: 12.4 % (ref 11.5–15.5)
WBC: 8.7 10*3/uL (ref 4.0–10.5)
nRBC: 0 % (ref 0.0–0.2)

## 2022-07-21 LAB — TYPE AND SCREEN
ABO/RH(D): O NEG
Antibody Screen: NEGATIVE

## 2022-07-21 LAB — POCT PREGNANCY, URINE: Preg Test, Ur: NEGATIVE

## 2022-07-21 LAB — ABO/RH: ABO/RH(D): O NEG

## 2022-07-21 SURGERY — DILATATION & CURETTAGE/HYSTEROSCOPY WITH MYOSURE
Anesthesia: General | Site: Vagina

## 2022-07-21 MED ORDER — PROPOFOL 10 MG/ML IV BOLUS
INTRAVENOUS | Status: AC
  Filled 2022-07-21: qty 20

## 2022-07-21 MED ORDER — MIDAZOLAM HCL 2 MG/2ML IJ SOLN
INTRAMUSCULAR | Status: AC
  Filled 2022-07-21: qty 2

## 2022-07-21 MED ORDER — PROPOFOL 500 MG/50ML IV EMUL
INTRAVENOUS | Status: DC | PRN
  Administered 2022-07-21: 200 ug/kg/min via INTRAVENOUS

## 2022-07-21 MED ORDER — ACETAMINOPHEN 500 MG PO TABS
ORAL_TABLET | ORAL | Status: AC
  Filled 2022-07-21: qty 2

## 2022-07-21 MED ORDER — KETOROLAC TROMETHAMINE 30 MG/ML IJ SOLN
INTRAMUSCULAR | Status: DC | PRN
  Administered 2022-07-21: 30 mg via INTRAVENOUS

## 2022-07-21 MED ORDER — ONDANSETRON HCL 4 MG/2ML IJ SOLN
INTRAMUSCULAR | Status: DC | PRN
  Administered 2022-07-21: 4 mg via INTRAVENOUS

## 2022-07-21 MED ORDER — IBUPROFEN 200 MG PO TABS: 600.0000 mg | ORAL_TABLET | Freq: Four times a day (QID) | ORAL | 0 refills | Status: DC | PRN

## 2022-07-21 MED ORDER — OXYCODONE HCL 5 MG PO TABS: 5.0000 mg | ORAL_TABLET | Freq: Four times a day (QID) | ORAL | 0 refills | Status: AC | PRN

## 2022-07-21 MED ORDER — CEFAZOLIN SODIUM-DEXTROSE 2-4 GM/100ML-% IV SOLN
INTRAVENOUS | Status: AC
  Filled 2022-07-21: qty 100

## 2022-07-21 MED ORDER — MIDAZOLAM HCL 2 MG/2ML IJ SOLN
INTRAMUSCULAR | Status: DC | PRN
  Administered 2022-07-21: 2 mg via INTRAVENOUS

## 2022-07-21 MED ORDER — POVIDONE-IODINE 10 % EX SWAB: 2.0000 | Freq: Once | CUTANEOUS | Status: DC

## 2022-07-21 MED ORDER — CEFAZOLIN SODIUM-DEXTROSE 2-4 GM/100ML-% IV SOLN
2.0000 g | INTRAVENOUS | Status: AC
  Administered 2022-07-21: 2 g via INTRAVENOUS

## 2022-07-21 MED ORDER — DEXAMETHASONE SODIUM PHOSPHATE 10 MG/ML IJ SOLN
INTRAMUSCULAR | Status: AC
  Filled 2022-07-21: qty 1

## 2022-07-21 MED ORDER — ONDANSETRON HCL 4 MG/2ML IJ SOLN
INTRAMUSCULAR | Status: AC
  Filled 2022-07-21: qty 2

## 2022-07-21 MED ORDER — HYDROMORPHONE HCL 1 MG/ML IJ SOLN: 0.2500 mg | INTRAMUSCULAR | Status: DC | PRN

## 2022-07-21 MED ORDER — DEXAMETHASONE SODIUM PHOSPHATE 10 MG/ML IJ SOLN
INTRAMUSCULAR | Status: DC | PRN
  Administered 2022-07-21: 10 mg via INTRAVENOUS

## 2022-07-21 MED ORDER — FENTANYL CITRATE (PF) 100 MCG/2ML IJ SOLN
INTRAMUSCULAR | Status: AC
  Filled 2022-07-21: qty 2

## 2022-07-21 MED ORDER — LIDOCAINE 2% (20 MG/ML) 5 ML SYRINGE
INTRAMUSCULAR | Status: DC | PRN
  Administered 2022-07-21: 60 mg via INTRAVENOUS

## 2022-07-21 MED ORDER — SCOPOLAMINE 1 MG/3DAYS TD PT72
1.0000 | MEDICATED_PATCH | TRANSDERMAL | Status: DC
  Administered 2022-07-21: 1.5 mg via TRANSDERMAL

## 2022-07-21 MED ORDER — LIDOCAINE-EPINEPHRINE 1 %-1:100000 IJ SOLN
INTRAMUSCULAR | Status: DC | PRN
  Administered 2022-07-21: 20 mL

## 2022-07-21 MED ORDER — KETOROLAC TROMETHAMINE 30 MG/ML IJ SOLN
INTRAMUSCULAR | Status: AC
  Filled 2022-07-21: qty 1

## 2022-07-21 MED ORDER — FENTANYL CITRATE (PF) 100 MCG/2ML IJ SOLN
INTRAMUSCULAR | Status: DC | PRN
  Administered 2022-07-21 (×2): 50 ug via INTRAVENOUS

## 2022-07-21 MED ORDER — SCOPOLAMINE 1 MG/3DAYS TD PT72
MEDICATED_PATCH | TRANSDERMAL | Status: AC
  Filled 2022-07-21: qty 1

## 2022-07-21 MED ORDER — SODIUM CHLORIDE 0.9 % IR SOLN
Status: DC | PRN
  Administered 2022-07-21: 3000 mL

## 2022-07-21 MED ORDER — ACETAMINOPHEN 500 MG PO TABS
1000.0000 mg | ORAL_TABLET | Freq: Once | ORAL | Status: AC
  Administered 2022-07-21: 1000 mg via ORAL

## 2022-07-21 MED ORDER — LACTATED RINGERS IV SOLN: INTRAVENOUS | Status: DC

## 2022-07-21 MED ORDER — PROPOFOL 10 MG/ML IV BOLUS
INTRAVENOUS | Status: DC | PRN
  Administered 2022-07-21: 40 mg via INTRAVENOUS
  Administered 2022-07-21: 140 mg via INTRAVENOUS

## 2022-07-21 SURGICAL SUPPLY — 24 items
CATH ROBINSON RED A/P 16FR (CATHETERS) ×1 IMPLANT
DEVICE MYOSURE LITE (MISCELLANEOUS) IMPLANT
DEVICE MYOSURE REACH (MISCELLANEOUS) IMPLANT
DRSG TELFA 3X8 NADH STRL (GAUZE/BANDAGES/DRESSINGS) ×1 IMPLANT
ELECT REM PT RETURN 9FT ADLT (ELECTROSURGICAL)
ELECTRODE REM PT RTRN 9FT ADLT (ELECTROSURGICAL) IMPLANT
GAUZE 4X4 16PLY ~~LOC~~+RFID DBL (SPONGE) ×2 IMPLANT
GLOVE BIO SURGEON STRL SZ7 (GLOVE) ×1 IMPLANT
GLOVE BIOGEL PI IND STRL 7.0 (GLOVE) ×2 IMPLANT
GLOVE SURG SS PI 7.0 STRL IVOR (GLOVE) ×1 IMPLANT
GOWN STRL REUS W/TWL LRG LVL3 (GOWN DISPOSABLE) ×2 IMPLANT
HIBICLENS CHG 4% 4OZ BTL (MISCELLANEOUS) ×1 IMPLANT
IV NS IRRIG 3000ML ARTHROMATIC (IV SOLUTION) ×1 IMPLANT
KIT PROCEDURE FLUENT (KITS) ×1 IMPLANT
KIT TURNOVER CYSTO (KITS) ×1 IMPLANT
PACK VAGINAL MINOR WOMEN LF (CUSTOM PROCEDURE TRAY) ×1 IMPLANT
PAD OB MATERNITY 4.3X12.25 (PERSONAL CARE ITEMS) ×1 IMPLANT
PAD PREP 24X48 CUFFED NSTRL (MISCELLANEOUS) ×1 IMPLANT
SEAL CERVICAL OMNI LOK (ABLATOR) IMPLANT
SEAL ROD LENS SCOPE MYOSURE (ABLATOR) ×1 IMPLANT
SET GENESYS HTA PROCERVA (MISCELLANEOUS) IMPLANT
SOL PREP POV-IOD 4OZ 10% (MISCELLANEOUS) IMPLANT
TOWEL OR 17X24 6PK STRL BLUE (TOWEL DISPOSABLE) IMPLANT
TOWEL OR 17X26 10 PK STRL BLUE (TOWEL DISPOSABLE) ×1 IMPLANT

## 2022-07-21 NOTE — Transfer of Care (Signed)
Immediate Anesthesia Transfer of Care Note  Patient: Michele Andersen  Procedure(s) Performed: Procedure(s) (LRB): DILATATION & CURETTAGE/HYSTEROSCOPY WITH MYOSURE (N/A) DILATATION & CURETTAGE/HYSTEROSCOPY WITH HYDROTHERMAL ABLATION (N/A)  Patient Location: PACU  Anesthesia Type: General  Level of Consciousness: awake, alert  and oriented  Airway & Oxygen Therapy: Patient Spontanous Breathing and Patient connected to nasal cannula oxygen  Post-op Assessment: Report given to PACU RN and Post -op Vital signs reviewed and stable  Post vital signs: Reviewed and stable  Complications: No apparent anesthesia complications Last Vitals:  Vitals Value Taken Time  BP 114/81 07/21/22 1300  Temp 36.5 C 07/21/22 1255  Pulse 58 07/21/22 1302  Resp 15 07/21/22 1302  SpO2 100 % 07/21/22 1302  Vitals shown include unvalidated device data.  Last Pain:  Vitals:   07/21/22 1255  TempSrc:   PainSc: 0-No pain      Patients Stated Pain Goal: 4 (35/68/61 [id number])  Complications: No notable events documented.

## 2022-07-21 NOTE — Anesthesia Postprocedure Evaluation (Signed)
Anesthesia Post Note  Patient: Michele Andersen  Procedure(s) Performed: DILATATION & CURETTAGE/HYSTEROSCOPY WITH MYOSURE ([place] ) DILATATION & CURETTAGE/HYSTEROSCOPY WITH HYDROTHERMAL ABLATION (Vagina )     Patient location during evaluation: PACU Anesthesia Type: General Level of consciousness: awake and alert Pain management: pain level controlled Vital Signs Assessment: post-procedure vital signs reviewed and stable Respiratory status: spontaneous breathing, nonlabored ventilation and respiratory function stable Cardiovascular status: blood pressure returned to baseline and stable Postop Assessment: no apparent nausea or vomiting Anesthetic complications: no  No notable events documented.  Last Vitals:  Vitals:   07/21/22 1315 07/21/22 1330  BP: 133/84 (!) 124/57  Pulse: 60 68  Resp: 14 19  Temp:  36.5 C  SpO2: 93% 100%    Last Pain:  Vitals:   07/21/22 1330  TempSrc:   PainSc: 2                  Keidrick Murty,W. EDMOND

## 2022-07-21 NOTE — Anesthesia Preprocedure Evaluation (Addendum)
Anesthesia Evaluation  Patient identified by MRN, date of birth, ID band Patient awake    Reviewed: Allergy & Precautions, H&P , NPO status , Patient's Chart, lab work & pertinent test results  History of Anesthesia Complications (+) PONV and history of anesthetic complications  Airway Mallampati: II  TM Distance: >3 FB Neck ROM: Full    Dental no notable dental hx. (+) Teeth Intact, Dental Advisory Given   Pulmonary former smoker   Pulmonary exam normal breath sounds clear to auscultation       Cardiovascular negative cardio ROS  Rhythm:Regular Rate:Normal     Neuro/Psych   Anxiety Depression    negative neurological ROS     GI/Hepatic negative GI ROS, Neg liver ROS,,,  Endo/Other  negative endocrine ROS    Renal/GU negative Renal ROS  negative genitourinary   Musculoskeletal   Abdominal   Peds  Hematology  (+) Blood dyscrasia, anemia   Anesthesia Other Findings   Reproductive/Obstetrics negative OB ROS                             Anesthesia Physical Anesthesia Plan  ASA: 2  Anesthesia Plan: General   Post-op Pain Management: Tylenol PO (pre-op)* and Toradol IV (intra-op)*   Induction: Intravenous  PONV Risk Score and Plan: 4 or greater and Ondansetron, Dexamethasone, Propofol infusion, TIVA, Midazolam and Scopolamine patch - Pre-op  Airway Management Planned: Oral ETT  Additional Equipment:   Intra-op Plan:   Post-operative Plan: Extubation in OR  Informed Consent: I have reviewed the patients History and Physical, chart, labs and discussed the procedure including the risks, benefits and alternatives for the proposed anesthesia with the patient or authorized representative who has indicated his/her understanding and acceptance.     Dental advisory given  Plan Discussed with: CRNA  Anesthesia Plan Comments:        Anesthesia Quick Evaluation

## 2022-07-21 NOTE — Op Note (Signed)
07/21/2022 Michele Andersen  Preoperative diagnosis: Submucosal myomas. Dysmenorrhea and menorrhagia  Postop diagnosis: as above.  Procedure: Hysteroscopic Myosure myomectomy and HTA endometrial ablation  Anesthesia General via LMA and Paracervical block  Surgeon: Azucena Fallen, MD  Assistant: N/A  IV fluids 600 cc Estimated blood loss 5 cc  Urine output: NA, voided before moving back to OR    Complications none  Condition: stable  Disposition PACU  Specimen: Submucosal myomas chips and endometrial curettings   Procedure  Indication: Dysmenorrhea and menorrhagia from submucosal myomas.  Patient was counseled on risks/ complications including infection, bleeding, damage to internal organs, she understood and agrees, gave informed written consent for Myosure hysteroscopic myomectomy and HTA endometrial ablation.   Patient was brought to the operating room with IV running. Time out was carried out. She received preop 2 gm Ancef. She underwent general anesthesia via LMA without complications. She was given dorsolithotomy position. Parts were prepped and draped in standard fashion. Bimanual exam revealed uterus to be anteverted and 8 weeks size. Speculum was placed and cervix was grasped with single-tooth tenaculum. Cervical block with 20 cc 1% plain Xylocaine given. The uterus was sounded to 9 cm. Cervical length was 5 cm. Cervical os was dilated to 19 [place] dilator. Myosure Reach Hysteroscope was introduced in the uterine cavity under vision, using saline for irrigation. Findings: two submucosal myomas noted and normal fundus and ostii noted.  Hysteroscopic myoma resection performed for two myomas using Myosure Reach. Both completed well. Then endometrial curettings obtained using Reach. Hemostasis noted.  Fluid deficit 60  cc   Now HTA -hydrothermal endometrial ablation was performed. Seal test passed, fluid warmed within expected time and ablation cycle was completed without any  complications.  Post ablation endometrial cavity noted excellent ablation.   All instruments removed. Hemostasis noted.  All counts are correct x2. No complications. Patient brought to the recovery room in stable condition.  Patient will be discharged home today. Post op care reviewed.   Azucena Fallen, MD.   [facility]

## 2022-07-21 NOTE — Anesthesia Procedure Notes (Signed)
Procedure Name: LMA Insertion Date/Time: 07/21/2022 11:57 AM  Performed by: Mechele Claude, CRNAPre-anesthesia Checklist: Patient identified, Emergency Drugs available, Suction available and Patient being monitored Patient Re-evaluated:Patient Re-evaluated prior to induction Oxygen Delivery Method: Circle system utilized Preoxygenation: Pre-oxygenation with 100% oxygen Induction Type: IV induction Ventilation: Mask ventilation without difficulty LMA: LMA inserted LMA Size: 4.0 Number of attempts: 1 Airway Equipment and Method: Bite block Placement Confirmation: positive ETCO2 Tube secured with: Tape Dental Injury: Teeth and Oropharynx as per pre-operative assessment

## 2022-07-21 NOTE — Discharge Instructions (Addendum)
No acetaminophen/Tylenol until after 3:45pm today if needed for pain.     No ibuprofen, Advil, Aleve, Motrin, ketorolac, meloxicam, naproxen, or other NSAIDS until after 7:00pm today if needed for pain.      DISCHARGE INSTRUCTIONS: HYSTEROSCOPY / ENDOMETRIAL ABLATION The following instructions have been prepared to help you care for yourself upon your return home.   Drink plenty of water.  Personal hygiene:  Use sanitary pads for vaginal drainage, not tampons.  Shower the day after your procedure.  NO tub baths, pools or Jacuzzis for 2-3 weeks.  Wipe front to back after using the bathroom.  Activity and limitations:  Do NOT drive or operate any equipment for 24 hours. The effects of anesthesia are still present and drowsiness may result.  Do NOT rest in bed all day.  Walking is encouraged.  Walk up and down stairs slowly.  You may resume your normal activity in one to two days or as indicated by your physician. Sexual activity: NO intercourse for at least 2 weeks after the procedure, or as indicated by your Doctor.  Diet: Eat a light meal as desired this evening. You may resume your usual diet tomorrow.  Return to Work: You may resume your work activities in one to two days or as indicated by Marine scientist.  What to expect after your surgery: Expect to have vaginal bleeding/discharge for 2-3 days and spotting for up to 10 days. It is not unusual to have soreness for up to 1-2 weeks. You may have a slight burning sensation when you urinate for the first day. Mild cramps may continue for a couple of days. You may have a regular period in 2-6 weeks.  Call your doctor for any of the following:  Excessive vaginal bleeding or clotting, saturating and changing one pad every hour.  Inability to urinate 6 hours after discharge from hospital.  Pain not relieved by pain medication.  Fever of 100.4 F or greater.  Unusual vaginal discharge or odor.           Post Anesthesia  Home Care Instructions  Activity: Get plenty of rest for the remainder of the day. A responsible individual must stay with you for 24 hours following the procedure.  For the next 24 hours, DO NOT: -Drive a car -Paediatric nurse -Drink alcoholic beverages -Take any medication unless instructed by your physician -Make any legal decisions or sign important papers.  Meals: Start with liquid foods such as gelatin or soup. Progress to regular foods as tolerated. Avoid greasy, spicy, heavy foods. If nausea and/or vomiting occur, drink only clear liquids until the nausea and/or vomiting subsides. Call your physician if vomiting continues.  Special Instructions/Symptoms: Your throat may feel dry or sore from the anesthesia or the breathing tube placed in your throat during surgery. If this causes discomfort, gargle with warm salt water. The discomfort should disappear within 24 hours.  If you had a scopolamine patch placed behind your ear for the management of post- operative nausea and/or vomiting:  1. The medication in the patch is effective for 72 hours, after which it should be removed.  Wrap patch in a tissue and discard in the trash. Wash hands thoroughly with soap and water. 2. You may remove the patch earlier than 72 hours if you experience unpleasant side effects which may include dry mouth, dizziness or visual disturbances. 3. Avoid touching the patch. Wash your hands with soap and water after contact with the patch.

## 2022-07-22 ENCOUNTER — Encounter (HOSPITAL_BASED_OUTPATIENT_CLINIC_OR_DEPARTMENT_OTHER): Payer: Self-pay | Admitting: Obstetrics & Gynecology

## 2022-07-22 LAB — SURGICAL PATHOLOGY

## 2022-09-28 ENCOUNTER — Encounter: Payer: Self-pay | Admitting: Internal Medicine

## 2022-09-30 ENCOUNTER — Other Ambulatory Visit: Payer: Self-pay | Admitting: Internal Medicine

## 2022-09-30 DIAGNOSIS — R21 Rash and other nonspecific skin eruption: Secondary | ICD-10-CM

## 2022-09-30 MED ORDER — TRIAMCINOLONE ACETONIDE 0.1 % EX CREA: 1.0000 | TOPICAL_CREAM | Freq: Two times a day (BID) | CUTANEOUS | 1 refills | Status: AC

## 2022-09-30 NOTE — Progress Notes (Signed)
[facility]  sent to pharmacy  if  persistent or progressive then plan visit

## 2022-09-30 NOTE — Telephone Encounter (Signed)
I sent in refill of tmc  as requested .   Visit if  persistent or progressive

## 2023-01-13 ENCOUNTER — Encounter: Payer: Self-pay | Admitting: Podiatry

## 2023-01-13 ENCOUNTER — Ambulatory Visit (INDEPENDENT_AMBULATORY_CARE_PROVIDER_SITE_OTHER): Payer: 59

## 2023-01-13 ENCOUNTER — Ambulatory Visit (INDEPENDENT_AMBULATORY_CARE_PROVIDER_SITE_OTHER): Payer: 59 | Admitting: Podiatry

## 2023-01-13 DIAGNOSIS — G5761 Lesion of plantar nerve, right lower limb: Secondary | ICD-10-CM

## 2023-01-13 MED ORDER — MELOXICAM 15 MG PO TABS: 15.0000 mg | ORAL_TABLET | Freq: Every day | ORAL | 0 refills | Status: DC

## 2023-01-13 NOTE — Progress Notes (Signed)
  Subjective:  Patient ID: Michele Andersen, female    DOB: Dec 03, 1975,   MRN: [id number]  Chief Complaint  Patient presents with   Foot Pain    Right foot pain in the ball of the foot     47 y.o. female presents for right foot pain that has been going on for several months. Relates she likes to walk. Does have history of fourth and fifth metatarsal fractures. Relates the ball of the foot has been painful when waling. Has tried ibuprofen with minimal releif.  . Denies any other pedal complaints. Denies n/v/f/c.   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 CFT <3 seconds. Normal hair growth on digits. No edema.  Skin. No lacerations or abrasions bilateral feet.  Musculoskeletal: MMT 5/5 bilateral lower extremities in DF, PF, Inversion and Eversion. Deceased ROM in DF of ankle joint. Tender to first second and third interspaces. Positive mulders click in third interspace. No pain with ROM of the lesser MPJs.  Neurological: Sensation intact to light touch.   Assessment:   1. Morton's neuroma, right      Plan:  Patient was evaluated and treated and all questions answered. Discussed neuroma and treatment options with patient.  Radiographs reviewed and discussed with patient. No acute fractures or dislocations. Appears to be old age indeterminate fracture to fourth metatarsal neck and fifth metatarsal.  Deferred injection.  Discussed padding and offloading today.  Prescription for meloxicam provided.  Discussed if pain does not improve may consider  MRI for further surgical planning or  injection.  Patient to return in 6 weeks or sooner if concerns arise.     Louann Sjogren, DPM

## 2023-02-02 ENCOUNTER — Other Ambulatory Visit: Payer: Self-pay | Admitting: Internal Medicine

## 2023-02-07 ENCOUNTER — Telehealth (INDEPENDENT_AMBULATORY_CARE_PROVIDER_SITE_OTHER): Payer: 59 | Admitting: Internal Medicine

## 2023-02-07 ENCOUNTER — Encounter: Payer: Self-pay | Admitting: Internal Medicine

## 2023-02-07 VITALS — Ht 63.0 in | Wt 138.0 lb

## 2023-02-07 DIAGNOSIS — F419 Anxiety disorder, unspecified: Secondary | ICD-10-CM | POA: Insufficient documentation

## 2023-02-07 DIAGNOSIS — Z79899 Other long term (current) drug therapy: Secondary | ICD-10-CM

## 2023-02-07 MED ORDER — HYDROXYZINE PAMOATE 25 MG PO CAPS: 25.0000 mg | ORAL_CAPSULE | Freq: Three times a day (TID) | ORAL | 1 refills | Status: DC | PRN

## 2023-02-07 NOTE — Assessment & Plan Note (Signed)
Add hydroxyzine as needed 25 mg  cont lexapro 10 fu 2-3 months or as needed  Advised counseling may help

## 2023-02-07 NOTE — Progress Notes (Signed)
Virtual Visit via Video Note  I connected with Michele Andersen on 02/07/23 at  4:00 PM EDT by a video enabled telemedicine application and verified that I am speaking with the correct person using two identifiers. Location patient: home Location provider:work  office Persons participating in the virtual visit: patient, provider  Patient aware  of the limitations of evaluation and management by telemedicine and  availability of in person appointments. and agreed to proceed.   HPI: Michele Andersen presents for video visit  Fu meds   for anxiety lexapro seems to help  At times night hard to fall asleep thoughts  uses techniques  listening   Waxes and wanes   Thought about counseling not now.  ROS: See pertinent positives and negatives per HPI.  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   DILITATION & CURRETTAGE/HYSTROSCOPY WITH HYDROTHERMAL ABLATION N/A 07/21/2022   Procedure: DILATATION & CURETTAGE/HYSTEROSCOPY WITH HYDROTHERMAL ABLATION;  Surgeon: Shea Evans, MD;  Location: [facility];  Service:  Gynecology;  Laterality: N/A;   ELBOW SURGERY Right 12/07/2015   @[facility]   by dr d. Janee Morn;    RIGHT EPICONDYLAR DEBRIDEMENT AND RADIAL NEUROPLASTY   WRIST GANGLION EXCISION Right 12/13/2019   @[facility]    by   dr d. Janee Morn;    AND RIGHT EPICONDYLAR DEBRIDEMENT W/ RADIAL NEUROPLASTY    Family History  Problem Relation Age of Onset   Hyperlipidemia Mother    Diabetes Father    Hyperlipidemia Father     Social History   Tobacco Use   Smoking status: Former    Packs/day: 0.50    Years: 4.00    Additional pack years: 0.00    Total pack years: 2.00    Types: Cigarettes    Quit date: 2004    Years since quitting: 20.4   Smokeless tobacco: Never  Vaping Use   Vaping Use: Never used  Substance Use Topics   Alcohol use: Yes    Comment: occ    Drug use: Never      Current Outpatient Medications:    cetirizine (ZYRTEC) 10 MG tablet, Take 10 mg by mouth daily., Disp: , Rfl:    escitalopram (LEXAPRO) 10 MG tablet, TAKE 1 TABLET(10 MG) BY MOUTH DAILY, Disp: 90 tablet, Rfl: 2   hydrOXYzine (VISTARIL) 25 MG capsule, Take 1 capsule (25 mg total) by mouth every 8 (eight) hours as needed. For anxiety, Disp: 30 capsule, Rfl: 1   meloxicam (MOBIC) 15 MG tablet, Take 1 tablet (15 mg total) by mouth daily. (Patient taking differently: Take 15 mg by mouth as needed.), Disp: 30 tablet, Rfl: 0  Multiple Vitamins-Minerals (MULTIVITAMIN WITH MINERALS) tablet, Take 1 tablet by mouth daily., Disp: , Rfl:    triamcinolone cream (KENALOG) 0.1 %, Apply 1 Application topically 2 (two) times daily. For eczema, Disp: 30 g, Rfl: 1   ibuprofen (ADVIL) 200 MG tablet, Take 3 tablets (600 mg total) by mouth every 6 (six) hours as needed. (Patient not taking: Reported on 02/07/2023), Disp: 30 tablet, Rfl: 0  EXAM: BP Readings from Last 3 Encounters:  07/21/22 135/86  05/11/22 132/80  08/24/21 98/70    VITALS per patient if applicable:  GENERAL: alert, oriented, appears well and in no acute distress  HEENT:  atraumatic, conjunttiva clear, no obvious abnormalities on inspection of external nose and ears  NECK: normal movements of the head and neck  LUNGS: on inspection no signs of respiratory distress, breathing rate appears normal, no obvious gross SOB, gasping or wheezing  CV: no obvious cyanosis  MS: moves all visible extremities without noticeable abnormality  PSYCH/NEURO: pleasant and cooperative, speech and thought processing grossly intact Lab Results  Component Value Date   WBC 8.7 07/21/2022   HGB 14.3 07/21/2022   HCT 43.3 07/21/2022   PLT 348 07/21/2022   GLUCOSE 90 05/05/2022   CHOL 159 05/05/2022   TRIG 101.0 05/05/2022   HDL 42.70 05/05/2022   LDLCALC 96 05/05/2022   ALT 12 05/05/2022   AST 14 05/05/2022   NA 139 05/05/2022   K 4.1 05/05/2022   CL 105 05/05/2022   CREATININE 0.65 05/05/2022   BUN 12 05/05/2022   CO2 26 05/05/2022   TSH 2.12 05/05/2022    ASSESSMENT AND PLAN:  Discussed the following assessment and plan:    ICD-10-CM   1. Anxiety disorder, unspecified type  F41.9    sometimes effecting sleep    2. Medication management  Z79.899       Counseled.  Add counseling  Trial hydroxyzine at night  or as needed q8  Continue lexapro 10 for now  Plan fu in 2-3 months or as needed  Expectant management and discussion of plan and treatment with opportunity to ask questions and all were answered. The patient agreed with the plan and demonstrated an understanding of the instructions.   Advised to call back or seek an in-person evaluation if worsening  or having  further concerns  in interim. Return for 2-3 mos virtual ok.    Berniece Andreas, MD

## 2023-02-27 ENCOUNTER — Ambulatory Visit (INDEPENDENT_AMBULATORY_CARE_PROVIDER_SITE_OTHER): Payer: 59 | Admitting: Podiatry

## 2023-02-27 DIAGNOSIS — G5761 Lesion of plantar nerve, right lower limb: Secondary | ICD-10-CM

## 2023-02-27 MED ORDER — TRIAMCINOLONE ACETONIDE 10 MG/ML IJ SUSP
10.0000 mg | Freq: Once | INTRAMUSCULAR | Status: AC
  Administered 2023-02-27: 10 mg

## 2023-02-27 MED ORDER — DEXAMETHASONE SODIUM PHOSPHATE 120 MG/30ML IJ SOLN
4.0000 mg | Freq: Once | INTRAMUSCULAR | Status: AC
  Administered 2023-02-27: 4 mg via INTRA_ARTICULAR

## 2023-02-27 NOTE — Progress Notes (Signed)
  Subjective:  Patient ID: Michele Andersen, female    DOB: August 26, 1975,   MRN: [id number]  Chief Complaint  Patient presents with   Neuroma    Patient states neuroma in left foot hasn't improve    47 y.o. female presents for follow-up of right foot neuroma. Relates the padding and meloxicam are helpful but still having pain and interested in injection.  . Denies any other pedal complaints. Denies n/v/f/c.   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 CFT <3 seconds. Normal hair growth on digits. No edema.  Skin. No lacerations or abrasions bilateral feet.  Musculoskeletal: MMT 5/5 bilateral lower extremities in DF, PF, Inversion and Eversion. Deceased ROM in DF of ankle joint. Tender to first second and third interspaces. Positive mulders click in third interspace. No pain with ROM of the lesser MPJs.  Neurological: Sensation intact to light touch.   Assessment:   1. Morton's neuroma, right       Plan:  Patient was evaluated and treated and all questions answered. Discussed neuroma and treatment options with patient.  Radiographs reviewed and discussed with patient. No acute fractures or dislocations. Appears to be old age indeterminate fracture to fourth metatarsal neck and fifth metatarsal.  Injjection offered procedure below  Continue padding and anti inflammatories  Discussed if pain does not improve may consider  MRI for further surgical planning Patient to return in 6 weeks or sooner if concerns arise.    Procedure: Injection Tendon/Ligament Discussed alternatives, risks,  complications and verbal consent was obtained.  Location: Right third interspace. Skin Prep: Alcohol. Injectate: 1cc 0.5% marcaine plain, 1 cc dexamethasone 0.5 cc kenalog  Disposition: Patient tolerated procedure well. Injection site dressed with a band-aid.  Post-injection care was discussed and return precautions discussed.     Louann Sjogren, DPM

## 2023-04-10 ENCOUNTER — Ambulatory Visit: Payer: 59 | Admitting: Podiatry

## 2023-04-10 DIAGNOSIS — G5761 Lesion of plantar nerve, right lower limb: Secondary | ICD-10-CM

## 2023-04-10 MED ORDER — DEXAMETHASONE SODIUM PHOSPHATE 120 MG/30ML IJ SOLN
4.0000 mg | Freq: Once | INTRAMUSCULAR | Status: AC
  Administered 2023-04-10: 4 mg via INTRA_ARTICULAR

## 2023-04-10 MED ORDER — TRIAMCINOLONE ACETONIDE 10 MG/ML IJ SUSP
2.5000 mg | Freq: Once | INTRAMUSCULAR | Status: AC
  Administered 2023-04-10: 2.5 mg via INTRA_ARTICULAR

## 2023-04-10 NOTE — Progress Notes (Signed)
  Subjective:  Patient ID: Michele Andersen, female    DOB: 07-09-1976,   MRN: [id number]  Chief Complaint  Patient presents with   Neuroma    Pt stated that she still gets pain.     47 y.o. female presents for follow-up of right foot neuroma. Relates the last injection did help in that area but the other area is still acting up.  . Denies any other pedal complaints. Denies n/v/f/c.   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 CFT <3 seconds. Normal hair growth on digits. No edema.  Skin. No lacerations or abrasions bilateral feet.  Musculoskeletal: MMT 5/5 bilateral lower extremities in DF, PF, Inversion and Eversion. Deceased ROM in DF of ankle joint. Tender to first second and third interspaces. Positive mulders click in third interspace. No pain with ROM of the lesser MPJs.  Neurological: Sensation intact to light touch.   Assessment:   1. Morton's neuroma, right        Plan:  Patient was evaluated and treated and all questions answered. Discussed neuroma and treatment options with patient.  Radiographs reviewed and discussed with patient. No acute fractures or dislocations. Appears to be old age indeterminate fracture to fourth metatarsal neck and fifth metatarsal.  Injection offered procedure below this time to the first interspace Continue padding and anti inflammatories  Discussed if pain does not improve may consider  MRI for further surgical planning Patient to return in 6 weeks or sooner if concerns arise.    Procedure: Injection Tendon/Ligament Discussed  alternatives, risks, complications and verbal consent was obtained.  Location: Right second interspace. Skin Prep: Alcohol. Injectate: 1cc 0.5% marcaine plain, 1 cc dexamethasone 0.5 cc kenalog  Disposition: Patient tolerated procedure well. Injection site dressed with a band-aid.  Post-injection care was discussed and return precautions discussed.     Louann Sjogren, DPM

## 2023-04-17 ENCOUNTER — Other Ambulatory Visit: Payer: Self-pay | Admitting: Podiatry

## 2023-05-22 ENCOUNTER — Ambulatory Visit (INDEPENDENT_AMBULATORY_CARE_PROVIDER_SITE_OTHER): Payer: 59 | Admitting: Podiatry

## 2023-05-22 ENCOUNTER — Encounter: Payer: Self-pay | Admitting: Podiatry

## 2023-05-22 DIAGNOSIS — G5761 Lesion of plantar nerve, right lower limb: Secondary | ICD-10-CM

## 2023-05-22 NOTE — Progress Notes (Signed)
Subjective:  Patient ID: Michele Andersen, female    DOB: Aug 23, 1975,   MRN: [id number]  Chief Complaint  Patient presents with   Neuroma    Pt presents for follow-up of right foot neuroma, Pt stated that she still gets pain.     47 y.o. female presents for follow-up of right foot neuroma. Relates the last injection did help in that area but the pain is starting to return in both areas. Padding note as effective. Wanting to discuss next steps.   . Denies any other pedal complaints. Denies n/v/f/c.   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 CFT <3 seconds. Normal hair growth on digits. No edema.  Skin. No lacerations or abrasions bilateral feet.  Musculoskeletal: MMT 5/5 bilateral lower extremities in DF, PF, Inversion and Eversion. Deceased ROM in DF of ankle joint. Tender to first second and third interspaces. Positive mulders click in third interspace. No pain with ROM of the lesser MPJs.  Neurological: Sensation intact to light touch.   Assessment:   1. Morton's neuroma, right         Plan:  Patient was evaluated and treated and all questions answered. Discussed neuroma and treatment options with patient.  Radiographs reviewed and discussed with patient. No acute fractures or dislocations. Appears to be old age indeterminate fracture to fourth metatarsal neck and fifth metatarsal.  Discussed at this point getting MRI for further evaluation and possible surgical planning  Will hold off on injection today for better results on MRI.  MRI ordered.  Continue padding and anti  inflammatories  Patient to return after MRI.     Louann Sjogren, DPM

## 2023-06-10 ENCOUNTER — Ambulatory Visit
Admission: RE | Admit: 2023-06-10 | Discharge: 2023-06-10 | Disposition: A | Discharge status: Home / Self Care | Payer: 59 | Source: Ambulatory Visit | Attending: Podiatry | Admitting: Podiatry

## 2023-06-10 DIAGNOSIS — G5761 Lesion of plantar nerve, right lower limb: Secondary | ICD-10-CM

## 2023-07-04 ENCOUNTER — Telehealth: Payer: Self-pay | Admitting: Podiatry

## 2023-07-04 NOTE — Telephone Encounter (Signed)
I do not see them either. She may want to delay appointment until we get them in. Thanks

## 2023-07-04 NOTE — Telephone Encounter (Signed)
Pt called and has appt tomorrow to go over mri results. She is checking to make sure they are in.  I did not see the final results in the chart and wanted to confirm with you as well.

## 2023-07-04 NOTE — Telephone Encounter (Signed)
Rescheduled for 11/18

## 2023-07-05 ENCOUNTER — Ambulatory Visit: Payer: 59 | Admitting: Podiatry

## 2023-07-10 ENCOUNTER — Encounter: Payer: Self-pay | Admitting: Podiatry

## 2023-07-10 ENCOUNTER — Ambulatory Visit (INDEPENDENT_AMBULATORY_CARE_PROVIDER_SITE_OTHER): Payer: 59 | Admitting: Podiatry

## 2023-07-10 DIAGNOSIS — G5761 Lesion of plantar nerve, right lower limb: Secondary | ICD-10-CM | POA: Diagnosis not present

## 2023-07-10 DIAGNOSIS — M65971 Unspecified synovitis and tenosynovitis, right ankle and foot: Secondary | ICD-10-CM

## 2023-07-10 MED ORDER — DEXAMETHASONE SODIUM PHOSPHATE 120 MG/30ML IJ SOLN
4.0000 mg | Freq: Once | INTRAMUSCULAR | Status: AC
  Administered 2023-07-10: 4 mg via INTRA_ARTICULAR

## 2023-07-10 MED ORDER — TRIAMCINOLONE ACETONIDE 10 MG/ML IJ SUSP
2.5000 mg | Freq: Once | INTRAMUSCULAR | Status: AC
  Administered 2023-07-10: 2.5 mg via INTRA_ARTICULAR

## 2023-07-10 NOTE — Progress Notes (Signed)
  Subjective:  Patient ID: Michele Andersen, female    DOB: [birth year],   MRN: [id number]  No chief complaint on file.   47 y.o. female presents for follow-up of right foot neuroma. Here to review MRI. Padding note as effective. Wanting to discuss next steps.   . Denies any other pedal complaints. Denies n/v/f/c.   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 CFT <3 seconds. Normal hair growth on digits. No edema.  Skin. No lacerations or abrasions bilateral feet.  Musculoskeletal: MMT 5/5 bilateral lower extremities in DF, PF, Inversion and Eversion. Deceased ROM in DF of ankle joint. Tender to first second and third interspaces. Positive mulders click in third interspace. No pain with ROM of the lesser MPJs. More pain today noted in the first Interspace near the lateral sesamoid area.  Neurological: Sensation intact to light touch.   MRI right foot   IMPRESSION: 1. No acute injury of the right forefoot. 2. Mild arthritic changes of the medial hallux sesamoid-metatarsal articulation. Assessment:   1. Synovitis of right foot   2. Morton's neuroma, right          Plan:  Patient was evaluated and treated and all questions answered. Discussed neuroma and treatment options with patient.  Radiographs reviewed and discussed with patient. No acute fractures or dislocations. Appears to be old age indeterminate fracture to fourth metatarsal neck and fifth metatarsal.  Reviewed MRI with patient. No obvious neuroma present. There is some edema in interspaces 1 2 and 3 and possible some early  changes noted.  Continue padding and anti inflammatories  Discussed options including additional injections vs surgical correction.  Patient opts to continued conservative care. Injection offered today procedure below  Amb ref to PT to see if some massage and dry needling may help.  Return as needed.   Procedure: Injection Tendon/Ligament Discussed alternatives, risks, complications and verbal consent was obtained.  Location: Right first interspace. . Skin Prep: Alcohol. Injectate: 1cc 0.5% marcaine plain, 1 cc dexamethasone 0.5 cc kenalog  Disposition: Patient tolerated procedure well. Injection site dressed with a band-aid.  Post-injection care was discussed and return precautions discussed.      Louann Sjogren, DPM

## 2023-07-28 ENCOUNTER — Ambulatory Visit: Payer: 59 | Attending: Podiatry

## 2023-07-28 ENCOUNTER — Other Ambulatory Visit: Payer: Self-pay

## 2023-07-28 DIAGNOSIS — G5761 Lesion of plantar nerve, right lower limb: Secondary | ICD-10-CM | POA: Diagnosis not present

## 2023-07-28 DIAGNOSIS — M25571 Pain in right ankle and joints of right foot: Secondary | ICD-10-CM | POA: Insufficient documentation

## 2023-07-28 DIAGNOSIS — M65971 Unspecified synovitis and tenosynovitis, right ankle and foot: Secondary | ICD-10-CM | POA: Insufficient documentation

## 2023-07-28 DIAGNOSIS — R262 Difficulty in walking, not elsewhere classified: Secondary | ICD-10-CM | POA: Insufficient documentation

## 2023-07-28 NOTE — Therapy (Unsigned)
OUTPATIENT PHYSICAL THERAPY LOWER EXTREMITY EVALUATION   Patient Name: Michele Andersen MRN: [id number] DOB:28-Jul-1976, 47 y.o., female Today's Date: 07/28/2023  END OF SESSION:  Visit Number 1 Number of Visits 5 Date for PT re-eval 09/07/2022  Authorization Type [employer]   PT start time 1305 PT stop time 1347 PT time calculation (min) 42  Activity Tolerance: Patient tolerated treatment well  Behavior During Therapy: [facility] for tasks assessed/performed     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   DILITATION & CURRETTAGE/HYSTROSCOPY WITH HYDROTHERMAL ABLATION N/A 07/21/2022   Procedure: DILATATION & CURETTAGE/HYSTEROSCOPY WITH HYDROTHERMAL ABLATION;  Surgeon: Shea Evans, MD;  Location[facility];  Service: Gynecology;  Laterality: N/A;   ELBOW SURGERY Right 12/07/2015   @[facility]   by dr d. Janee Morn;    RIGHT EPICONDYLAR DEBRIDEMENT AND RADIAL NEUROPLASTY   WRIST GANGLION EXCISION Right 12/13/2019   @[facility]    by   dr d. 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   Allergic rhinitis 08/12/2010   DEPRESSION 07/27/2007   EUSTACHIAN TUBE DYSFUNCTION 07/27/2007   SINUSITIS- ACUTE-NOS 07/27/2007   ECZEMA 07/27/2007    PCP: Madelin Headings, MD  REFERRING PROVIDER: Louann Sjogren, DPM  REFERRING DIAG:  G57.61 (ICD-10-CM) - Morton's neuroma, right  M65.971 (ICD-10-CM) - Synovitis of right foot    THERAPY DIAG:  Pain in right ankle and joints of right foot  Difficulty in walking, not elsewhere classified  Rationale for Evaluation and Treatment: Rehabilitation  ONSET DATE: 09/22/2022  SUBJECTIVE:   SUBJECTIVE STATEMENT: Patient reports to PT today d/t chronic foot pain and presence of neuroma located laterally. She works a Health and safety inspector job, and is mostly sitting during the day, although she does have ability to use standing desk. She had a metatarsal fracture 4-5th in 2005, wearing boot.   PERTINENT HISTORY: PMHx included low back pain, depression/anxiety   PAIN:  Are you having pain?  Yes: NPRS scale: 5/10 Pain location: plantar surface of foot and distal-medial ankle Pain description: aching/soreness Aggravating factors: prolonged weight bearing (standing, walking)  Relieving factors: steroid injections, meloxicam   PRECAUTIONS: None  RED FLAGS: None   WEIGHT BEARING RESTRICTIONS: No  FALLS:  Has patient fallen in last 6 months? No  LIVING ENVIRONMENT: Lives with: lives with their spouse Lives in: House/apartment Stairs: Yes: Internal: 14 steps; railing present and External: 3 steps Has following equipment at home: None  OCCUPATION: Desk Job with [employer]  PLOF: Independent  PATIENT GOALS: To have less pain with standing and walking   NEXT MD VISIT: not scheduled at time of eval   OBJECTIVE:  Note: Objective measures were completed at Evaluation  unless otherwise noted.  DIAGNOSTIC FINDINGS:  06/10/2023 MRI Right Foot  IMPRESSION: 1. No acute injury of the right forefoot. 2. Mild arthritic changes of the medial hallux sesamoid-metatarsal articulation.  PATIENT SURVEYS:  FOTO 65 current, 70 predicted   COGNITION: Overall cognitive status: Within functional limits for tasks assessed     SENSATION: WFL   PALPATION: Mild tenderness to palpation   LOWER EXTREMITY ROM:  Active ROM Right eval Left eval  Hip flexion    Hip extension    Hip abduction    Hip adduction    Hip internal rotation    Hip external rotation    Knee flexion    Knee extension    Ankle dorsiflexion    Ankle plantarflexion    Ankle inversion    Ankle eversion     (Blank rows = not tested)  LOWER EXTREMITY MMT:  MMT Right eval Left eval  Ankle dorsiflexion  4+  Ankle plantarflexion    Ankle inversion  4  Ankle eversion  4   (Blank rows = not tested)  LOWER EXTREMITY SPECIAL TESTS:  Ankle special tests: Talar tilt test: negative and Great toe extension test: positive    GAIT: Distance walked: 50 ft Assistive device utilized: None Level of assistance: Complete Independence Comments: mild antalgic gait    TODAY'S TREATMENT:                                                                                                                               [facility] Adult PT Treatment:                                                DATE: 07/28/23  Initial evaluation: see patient education and home exercise program as noted below      PATIENT EDUCATION:  Education details: reviewed initial home exercise program; discussion of POC, prognosis and goals for skilled PT   Person educated: Patient Education method: Explanation, Demonstration, and Handouts Education comprehension: verbalized understanding, returned demonstration, and needs further education  HOME EXERCISE PROGRAM: Access Code: [email or web address] URL:  [email or web address] Date: 08/01/2023 Prepared by: Mauri Reading  Exercises - Seated Ankle Alphabet  - 2 x daily - 7 x weekly - 2 sets - Seated Toe Curl  - 2 x daily - 7 x weekly - 2 sets - 10 reps - Toe Spreading  - 2 x daily - 7 x weekly - 2 sets - 10 reps - Ankle Inversion Eversion Towel Slide  - 2 x daily - 7 x weekly - 2 sets - 10 reps -  Seated Toe Towel Scrunches  - 2 x daily - 7 x weekly - 2 sets - 10 reps - Seated Arch Lifts  - 2 x daily - 7 x weekly - 2 sets - 10 reps  ASSESSMENT:  CLINICAL IMPRESSION: Patient is a 47 y.o. female  who was seen today for physical therapy evaluation and treatment for Rt foot pain with strength deficits. She is has positive result on Great toe extension test. She has related pain and difficulty with prolonged weight bearing activity, including standing and walking. She requires skilled PT services at this time to address relevant deficits and improve overall function.     OBJECTIVE IMPAIRMENTS: Abnormal gait, decreased activity tolerance, difficulty walking, decreased strength, and pain.   ACTIVITY LIMITATIONS: standing, squatting, stairs, and locomotion level  PARTICIPATION LIMITATIONS: meal prep, cleaning, laundry, driving, shopping, community activity, and occupation  PERSONAL FACTORS: Past/current experiences, Time since onset of injury/illness/exacerbation, and 1 comorbidity: depression/anxiety  are also affecting patient's functional outcome.   REHAB POTENTIAL: Fair    CLINICAL DECISION MAKING: Stable/uncomplicated  EVALUATION COMPLEXITY: Low   GOALS: Goals reviewed with patient? No  SHORT TERM GOALS: Target date: 08/18/2023   Patient will be independent with initial home program for ankle and foot strengthening activities.  Baseline: provided at eval  Goal status: INITIAL   LONG TERM GOALS: Target date: 09/08/2023    Patient will report improved overall functional ability with FOTO score of 70 or greater   Baseline: 65 Goal status: INITIAL  2.  Patient will demonstrate 5/5 ankle strength.  Baseline: see objective measures  Goal status: INITIAL  3.  Patient will demonstrate ability to tolerate at least 25-30 minutes of weight bearing activities prior to requiring seated rest break.  Baseline: unable to tolerate >10 minutes Goal status: INITIAL  4.  Patient will demonstrate normal gait mechanics Baseline: see objective measures  Goal status: INITIAL     PLAN:  PT FREQUENCY: 1-2x/week  PT DURATION: 6 weeks  PLANNED INTERVENTIONS: 97164- PT Re-evaluation, 97110-Therapeutic exercises, 97530- Therapeutic activity, 97112- Neuromuscular re-education, 97535- Self Care, 43329- Manual therapy, Patient/Family education, Taping, Dry Needling, Joint mobilization, Cryotherapy, and Moist heat  PLAN FOR NEXT SESSION: manual therapy, TPDN, plantar fascia stretching, toe and instrinsic mm strengthening, MTJ mobilization, TPDN if indicated, ankle strengthening NWB => WB. Modalities as indicated    Mauri Reading, PT, DPT   08/01/2023, 6:37 PM

## 2023-08-02 ENCOUNTER — Ambulatory Visit: Payer: 59

## 2023-08-02 DIAGNOSIS — R262 Difficulty in walking, not elsewhere classified: Secondary | ICD-10-CM

## 2023-08-02 DIAGNOSIS — M25571 Pain in right ankle and joints of right foot: Secondary | ICD-10-CM

## 2023-08-02 NOTE — Therapy (Signed)
OUTPATIENT PHYSICAL THERAPY LOWER EXTREMITY EVALUATION   Patient Name: Michele Andersen MRN: [id number] DOB:1976/01/23, 47 y.o., female Today's Date: 08/02/2023   END OF SESSION:  PT End of Session - 08/02/23 1532     Visit Number 2    Number of Visits 5    Date for PT Re-Evaluation 09/07/22    Authorization Type [employer]    PT Start Time 1532    PT Stop Time 1605    PT Time Calculation (min) 33 min    Activity Tolerance Patient tolerated treatment well    Behavior During Therapy [facility] for tasks assessed/performed             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   DILITATION & CURRETTAGE/HYSTROSCOPY WITH HYDROTHERMAL ABLATION N/A 07/21/2022   Procedure: DILATATION & CURETTAGE/HYSTEROSCOPY WITH HYDROTHERMAL ABLATION;  Surgeon: Shea Evans, MD;  Location: [facility];  Service: Gynecology;  Laterality: N/A;   ELBOW SURGERY Right 12/07/2015   @[facility]   by dr d. Janee Morn;    RIGHT EPICONDYLAR DEBRIDEMENT AND RADIAL NEUROPLASTY   WRIST GANGLION EXCISION Right 12/13/2019   @[facility]    by   dr d. 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 She has been compliant with HEP and feels that she has only expected soreness.  PERTINENT HISTORY: PMHx included low back pain, depression/anxiety   PAIN:  Are you having pain?  Yes: NPRS scale: 5/10 Pain location: plantar surface of foot and distal-medial ankle Pain description: aching/soreness Aggravating factors: prolonged weight bearing (standing, walking)  Relieving factors: steroid injections, meloxicam   PRECAUTIONS: None  RED FLAGS: None   WEIGHT BEARING RESTRICTIONS: No  FALLS:  Has patient fallen in last 6 months? No  LIVING ENVIRONMENT: Lives with: lives with their spouse Lives in: House/apartment Stairs: Yes: Internal: 14 steps; railing  present and External: 3 steps Has following equipment at home: None  OCCUPATION: Desk Job with [employer]  PLOF: Independent  PATIENT GOALS: To have less pain with standing and walking   NEXT MD VISIT: not scheduled at time of eval   OBJECTIVE:  Note: Objective measures were completed at Evaluation unless otherwise noted.  DIAGNOSTIC FINDINGS:  06/10/2023 MRI Right  Foot  IMPRESSION: 1. No acute injury of the right forefoot. 2. Mild arthritic changes of the medial hallux sesamoid-metatarsal articulation.  PATIENT SURVEYS:  FOTO 65 current, 70 predicted   COGNITION: Overall cognitive status: Within functional limits for tasks assessed     SENSATION: WFL   PALPATION: Mild tenderness to palpation   LOWER EXTREMITY ROM:  Active ROM Right eval Left eval  Hip flexion    Hip extension    Hip abduction    Hip adduction    Hip internal rotation    Hip external rotation    Knee flexion    Knee extension    Ankle dorsiflexion    Ankle plantarflexion    Ankle inversion    Ankle eversion     (Blank rows = not tested)  LOWER EXTREMITY MMT:  MMT Right eval Left eval  Ankle dorsiflexion  4+  Ankle plantarflexion    Ankle inversion  4  Ankle eversion  4   (Blank rows = not tested)  LOWER EXTREMITY SPECIAL TESTS:  Ankle special tests: Talar tilt test: negative and Great toe extension test: positive    GAIT: Distance walked: 50 ft Assistive device utilized: None Level of assistance: Complete Independence Comments: mild antalgic gait    TODAY'S TREATMENT:         [facility] Adult PT Treatment:                                                DATE: 08/02/2023  Therapeutic Exercise: Toe curl/spreading in supine x 20  Towel scrunch/pull x 20  Marble Pick ups (3/4) cup x 1  Ankle alphabet x 2 Ankle INVER/EVER towel slides  x 20  Ankle 4-way strengthening x 15 each direction red TB   Manual Therapy: Joint mobilization MTJ  Plantar fascia stretch                                                                                                                     [facility] Adult PT Treatment:                                                DATE: 07/28/23  Initial evaluation: see patient education and home exercise program as noted below      PATIENT EDUCATION:  Education details: reviewed initial home exercise program; discussion of POC,  prognosis  and goals for skilled PT   Person educated: Patient Education method: Explanation, Demonstration, and Handouts Education comprehension: verbalized understanding, returned demonstration, and needs further education  HOME EXERCISE PROGRAM: Access Code: [email or web address] URL: [email or web address] Date: 08/01/2023 Prepared by: Mauri Reading  Exercises - Seated Ankle Alphabet  - 2 x daily - 7 x weekly - 2 sets - Seated Toe Curl  - 2 x daily - 7 x weekly - 2 sets - 10 reps - Toe Spreading  - 2 x daily - 7 x weekly - 2 sets - 10 reps - Ankle Inversion Eversion Towel Slide  - 2 x daily - 7 x weekly - 2 sets - 10 reps - Seated Toe Towel Scrunches  - 2 x daily - 7 x weekly - 2 sets - 10 reps - Seated Arch Lifts  - 2 x daily - 7 x weekly - 2 sets - 10 reps  ASSESSMENT:  CLINICAL IMPRESSION:  Patient responded well to initial treatment session. She is demonstrating good forefoot joint mobility following manual therapy and was able to perform toe strengthening activities without significant pain exacerbation. We will continue to progress forefoot, toe and ankle strengthening in preparation for incorporation of weight bearing activities.    OBJECTIVE IMPAIRMENTS: Abnormal gait, decreased activity tolerance, difficulty walking, decreased strength, and pain.   ACTIVITY LIMITATIONS: standing, squatting, stairs, and locomotion level  PARTICIPATION LIMITATIONS: meal prep, cleaning, laundry, driving, shopping, community activity, and occupation  PERSONAL FACTORS: Past/current experiences, Time since onset of injury/illness/exacerbation, and 1 comorbidity: depression/anxiety  are also affecting patient's functional outcome.   REHAB POTENTIAL: Fair    CLINICAL DECISION MAKING: Stable/uncomplicated  EVALUATION COMPLEXITY: Low   GOALS: Goals reviewed with patient? No  SHORT TERM GOALS: Target date: 08/18/2023   Patient will be independent with initial home program for ankle and  foot strengthening activities.  Baseline: provided at eval  Goal status: INITIAL   LONG TERM GOALS: Target date: 09/08/2023    Patient will report improved overall functional ability with FOTO score of 70 or greater  Baseline: 65 Goal status: INITIAL  2.  Patient will demonstrate 5/5 ankle strength.  Baseline: see objective measures  Goal status: INITIAL  3.  Patient will demonstrate ability to tolerate at least 25-30 minutes of weight bearing activities prior to requiring seated rest break.  Baseline: unable to tolerate >10 minutes Goal status: INITIAL  4.  Patient will demonstrate normal gait mechanics Baseline: see objective measures  Goal status: INITIAL     PLAN:  PT FREQUENCY: 1-2x/week  PT DURATION: 6 weeks  PLANNED INTERVENTIONS: 97164- PT Re-evaluation, 97110-Therapeutic exercises, 97530- Therapeutic activity, 97112- Neuromuscular re-education, 97535- Self Care, 54098- Manual therapy, Patient/Family education, Taping, Dry Needling, Joint mobilization, Cryotherapy, and Moist heat  PLAN FOR NEXT SESSION: manual therapy, TPDN, plantar fascia stretching, toe and instrinsic mm strengthening, MTJ mobilization, TPDN if indicated, ankle strengthening NWB => WB. Modalities as indicated    Mauri Reading, PT, DPT   08/02/2023, 4:13 PM

## 2023-08-09 ENCOUNTER — Ambulatory Visit: Payer: 59

## 2023-08-09 ENCOUNTER — Other Ambulatory Visit: Payer: Self-pay | Admitting: Internal Medicine

## 2023-08-09 DIAGNOSIS — R262 Difficulty in walking, not elsewhere classified: Secondary | ICD-10-CM

## 2023-08-09 DIAGNOSIS — M25571 Pain in right ankle and joints of right foot: Secondary | ICD-10-CM | POA: Diagnosis not present

## 2023-08-09 NOTE — Therapy (Signed)
OUTPATIENT PHYSICAL THERAPY LOWER EXTREMITY EVALUATION   Patient Name: Michele Andersen MRN: [id number] DOB:24-Mar-1976, 47 y.o., female Today's Date: 08/09/2023   END OF SESSION:  PT End of Session - 08/09/23 1818     Visit Number 3    Number of Visits 5    Date for PT Re-Evaluation 09/07/22    Authorization Type [employer]    PT Start Time 1748    PT Stop Time 1830    PT Time Calculation (min) 42 min    Activity Tolerance Patient tolerated treatment well    Behavior During Therapy [facility] for tasks assessed/performed              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   DILITATION & CURRETTAGE/HYSTROSCOPY WITH HYDROTHERMAL ABLATION N/A 07/21/2022   Procedure: DILATATION & CURETTAGE/HYSTEROSCOPY WITH HYDROTHERMAL ABLATION;  Surgeon: Shea Evans, MD;  Location: [facility];  Service: Gynecology;  Laterality: N/A;   ELBOW SURGERY Right 12/07/2015   @[facility]   by dr d. Janee Morn;    RIGHT EPICONDYLAR DEBRIDEMENT AND RADIAL NEUROPLASTY   WRIST GANGLION EXCISION Right 12/13/2019   @[facility]    by   dr d. 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   Allergic rhinitis 08/12/2010   DEPRESSION 07/27/2007   EUSTACHIAN TUBE DYSFUNCTION 07/27/2007   SINUSITIS- ACUTE-NOS 07/27/2007   ECZEMA 07/27/2007    PCP: Madelin Headings, MD  REFERRING PROVIDER: Louann Sjogren, DPM  REFERRING DIAG:  G57.61 (ICD-10-CM) - Morton's neuroma, right  M65.971 (ICD-10-CM) - Synovitis of right foot    THERAPY DIAG:  Pain in right ankle and joints of right foot  Difficulty in walking, not elsewhere classified  Rationale for Evaluation and Treatment: Rehabilitation  ONSET DATE: 09/22/2022  SUBJECTIVE:   SUBJECTIVE STATEMENT: Patient continues to report compliance with HEP.  She does identify intermittent pain along plantar surface of foot  PERTINENT HISTORY: PMHx included low back pain, depression/anxiety   PAIN:  Are you having pain?  Yes: NPRS scale: 5/10 Pain location: plantar surface of foot and distal-medial ankle Pain description: aching/soreness Aggravating factors: prolonged weight bearing (standing, walking)  Relieving factors: steroid injections, meloxicam   PRECAUTIONS: None  RED FLAGS: None   WEIGHT BEARING RESTRICTIONS: No  FALLS:  Has patient fallen in last 6 months? No  LIVING ENVIRONMENT: Lives with: lives with their spouse Lives in: House/apartment Stairs: Yes: Internal: 14 steps;  railing present and External: 3 steps Has following equipment at home: None  OCCUPATION: Desk Job with [employer]  PLOF: Independent  PATIENT GOALS: To have less pain with standing and walking   NEXT MD VISIT: not scheduled at time of eval   OBJECTIVE:  Note: Objective measures were completed at Evaluation unless otherwise noted.  DIAGNOSTIC FINDINGS:  06/10/2023 MRI Right  Foot  IMPRESSION: 1. No acute injury of the right forefoot. 2. Mild arthritic changes of the medial hallux sesamoid-metatarsal articulation.  PATIENT SURVEYS:  FOTO 65 current, 70 predicted   COGNITION: Overall cognitive status: Within functional limits for tasks assessed     SENSATION: WFL   PALPATION: Mild tenderness to palpation   LOWER EXTREMITY ROM:  Active ROM Right eval Left eval  Hip flexion    Hip extension    Hip abduction    Hip adduction    Hip internal rotation    Hip external rotation    Knee flexion    Knee extension    Ankle dorsiflexion    Ankle plantarflexion    Ankle inversion    Ankle eversion     (Blank rows = not tested)  LOWER EXTREMITY MMT:  MMT Right eval Left eval  Ankle dorsiflexion  4+  Ankle plantarflexion    Ankle inversion  4  Ankle eversion  4   (Blank rows = not tested)  LOWER EXTREMITY SPECIAL TESTS:  Ankle special tests: Talar tilt test: negative and Great toe extension test: positive    GAIT: Distance walked: 50 ft Assistive device utilized: None Level of assistance: Complete Independence Comments: mild antalgic gait    TODAY'S TREATMENT:         [facility] Adult PT Treatment:                                                DATE: 08/09/2023  Therapeutic Exercise: Towel scrunch/pull x 3 minutes  Ankle alphabet x 1 Ankle INVER/EVER towel slides  x 20  Ankle inver/ever strengthening red TB x 15 each  Ankle PF/DF strengthening GTB x 15 each   Manual Therapy: Joint mobilization MTJ  IASTYM along medial arch  Plantar fascia stretch   [facility] Adult PT Treatment:                                                DATE: 08/02/2023  Therapeutic Exercise: Toe curl/spreading in supine x 20  Towel scrunch/pull x 20  Marble Pick ups (3/4) cup x 1  Ankle alphabet x 2 Ankle INVER/EVER towel slides  x 20  Ankle 4-way strengthening x 15 each direction red TB   Manual Therapy: Joint mobilization MTJ  Plantar fascia stretch  [facility] Adult PT Treatment:                                                DATE: 07/28/23  Initial evaluation: see patient education and home exercise program as noted below      PATIENT EDUCATION:  Education details: reviewed initial home exercise program; discussion of POC, prognosis and goals for skilled PT   Person educated: Patient Education method: Explanation, Demonstration, and Handouts Education comprehension: verbalized understanding, returned demonstration, and needs further education  HOME EXERCISE PROGRAM: Access Code: [email or web address] URL: [email or web address] Date: 08/01/2023 Prepared by: Mauri Reading  Exercises - Seated Ankle Alphabet  - 2 x daily - 7 x weekly - 2 sets - Seated Toe Curl  - 2 x daily - 7 x weekly - 2 sets - 10 reps - Toe Spreading  - 2 x daily - 7 x weekly - 2 sets - 10 reps - Ankle Inversion Eversion Towel Slide  - 2 x daily - 7 x weekly - 2 sets - 10 reps - Seated Toe Towel Scrunches  - 2 x daily - 7 x weekly - 2 sets - 10 reps - Seated Arch Lifts  - 2 x daily - 7 x weekly - 2 sets - 10 reps  ASSESSMENT:  CLINICAL IMPRESSION:  Patient continues to respond well to current plan of care.  She was able to increase resistance with plantarflexion and dorsiflexion strengthening.  She may benefit from inclusion of arch lifting exercise at next visit.  She also responded well to use of a IASTYM along medial medial arch and plantar fascia.  Plan to follow-up on tolerance to manual therapy today.     OBJECTIVE IMPAIRMENTS: Abnormal gait, decreased activity tolerance, difficulty walking, decreased strength, and pain.   ACTIVITY LIMITATIONS: standing, squatting, stairs, and locomotion level  PARTICIPATION LIMITATIONS: meal prep, cleaning, laundry, driving, shopping, community activity, and occupation  PERSONAL FACTORS: Past/current  experiences, Time since onset of injury/illness/exacerbation, and 1 comorbidity: depression/anxiety  are also affecting patient's functional outcome.   REHAB POTENTIAL: Fair    CLINICAL DECISION MAKING: Stable/uncomplicated  EVALUATION COMPLEXITY: Low   GOALS: Goals reviewed with patient? No  SHORT TERM GOALS: Target date: 08/18/2023   Patient will be independent with initial home program for ankle and foot strengthening activities.  Baseline: provided at eval  Goal status: INITIAL   LONG TERM GOALS: Target date: 09/08/2023    Patient will report improved overall functional ability with FOTO score of 70 or greater  Baseline: 65 Goal status: INITIAL  2.  Patient will demonstrate 5/5 ankle strength.  Baseline: see objective measures  Goal status: INITIAL  3.  Patient will demonstrate ability to tolerate at least 25-30 minutes of weight bearing activities prior to requiring seated rest break.  Baseline: unable to tolerate >10 minutes Goal status: INITIAL  4.  Patient will demonstrate normal gait mechanics Baseline: see objective measures  Goal status: INITIAL     PLAN:  PT FREQUENCY: 1-2x/week  PT DURATION: 6 weeks  PLANNED INTERVENTIONS: 97164- PT Re-evaluation, 97110-Therapeutic exercises, 97530- Therapeutic activity, 97112- Neuromuscular re-education, 97535- Self Care, 41324- Manual therapy, Patient/Family education, Taping, Dry Needling, Joint mobilization, Cryotherapy, and Moist heat  PLAN FOR NEXT SESSION: manual therapy, TPDN, plantar fascia stretching, toe and instrinsic mm strengthening, MTJ mobilization, TPDN if indicated, ankle strengthening NWB => WB. Modalities as indicated  Mauri Reading, PT, DPT   08/09/2023, 6:41 PM

## 2023-08-14 ENCOUNTER — Ambulatory Visit: Payer: 59

## 2023-08-14 DIAGNOSIS — M25571 Pain in right ankle and joints of right foot: Secondary | ICD-10-CM

## 2023-08-14 DIAGNOSIS — R262 Difficulty in walking, not elsewhere classified: Secondary | ICD-10-CM

## 2023-08-14 NOTE — Therapy (Signed)
OUTPATIENT PHYSICAL THERAPY TREATMENT NOTE     Patient Name: Michele Andersen MRN: [id number] DOB:[birth year], 47 y.o., female Today's Date: 08/14/2023   END OF SESSION:  PT End of Session - 08/14/23 1437     Visit Number 4    Number of Visits 5    Date for PT Re-Evaluation 09/07/22    Authorization Type [employer]    PT Start Time 1445    PT Stop Time 1525    PT Time Calculation (min) 40 min    Activity Tolerance Patient tolerated treatment well    Behavior During Therapy [facility] for tasks assessed/performed             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   DILITATION & CURRETTAGE/HYSTROSCOPY WITH HYDROTHERMAL ABLATION N/A 07/21/2022   Procedure: DILATATION & CURETTAGE/HYSTEROSCOPY WITH HYDROTHERMAL ABLATION;  Surgeon: Shea Evans, MD;  Location: [facility];  Service: Gynecology;  Laterality: N/A;   ELBOW SURGERY Right 12/07/2015   @[facility]   by dr d. Janee Morn;    RIGHT EPICONDYLAR DEBRIDEMENT AND RADIAL NEUROPLASTY   WRIST GANGLION EXCISION Right 12/13/2019   @[facility]    by   dr d. 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   Allergic rhinitis 08/12/2010   DEPRESSION 07/27/2007   EUSTACHIAN TUBE DYSFUNCTION 07/27/2007   SINUSITIS- ACUTE-NOS 07/27/2007   ECZEMA 07/27/2007    PCP: Madelin Headings, MD  REFERRING PROVIDER: Louann Sjogren, DPM  REFERRING DIAG:  G57.61 (ICD-10-CM) - Morton's neuroma, right  M65.971 (ICD-10-CM) - Synovitis of right foot    THERAPY DIAG:  Pain in right ankle and joints of right foot  Difficulty in walking, not elsewhere classified  Rationale for Evaluation and Treatment: Rehabilitation  ONSET DATE: 09/22/2022  SUBJECTIVE:   SUBJECTIVE STATEMENT: She does identify intermittent pain along plantar surface of foot.   PERTINENT HISTORY: PMHx included low back pain, depression/anxiety   PAIN:  Are you having pain?  Yes: NPRS scale: 5/10 Pain location: plantar surface of foot and distal-medial ankle Pain description: aching/soreness Aggravating factors: prolonged weight bearing (standing, walking)  Relieving factors: steroid injections, meloxicam   PRECAUTIONS: None  RED FLAGS: None   WEIGHT BEARING RESTRICTIONS: No  FALLS:  Has patient fallen in last 6 months? No  LIVING ENVIRONMENT: Lives with: lives with their spouse Lives in: House/apartment Stairs: Yes: Internal: 14 steps; railing present and External: 3 steps Has  following equipment at home: None  OCCUPATION: Desk Job with [employer]  PLOF: Independent  PATIENT GOALS: To have less pain with standing and walking   NEXT MD VISIT: not scheduled at time of eval   OBJECTIVE:  Note: Objective measures were completed at Evaluation unless otherwise noted.  DIAGNOSTIC FINDINGS:  06/10/2023 MRI Right Foot  IMPRESSION: 1. No acute injury of the right  forefoot. 2. Mild arthritic changes of the medial hallux sesamoid-metatarsal articulation.  PATIENT SURVEYS:  FOTO 65 current, 70 predicted   COGNITION: Overall cognitive status: Within functional limits for tasks assessed     SENSATION: WFL   PALPATION: Mild tenderness to palpation   LOWER EXTREMITY ROM:  Active ROM Right eval Left eval  Hip flexion    Hip extension    Hip abduction    Hip adduction    Hip internal rotation    Hip external rotation    Knee flexion    Knee extension    Ankle dorsiflexion    Ankle plantarflexion    Ankle inversion    Ankle eversion     (Blank rows = not tested)  LOWER EXTREMITY MMT:  MMT Right eval Left eval  Ankle dorsiflexion  4+  Ankle plantarflexion    Ankle inversion  4  Ankle eversion  4   (Blank rows = not tested)  LOWER EXTREMITY SPECIAL TESTS:  Ankle special tests: Talar tilt test: negative and Great toe extension test: positive    GAIT: Distance walked: 50 ft Assistive device utilized: None Level of assistance: Complete Independence Comments: mild antalgic gait    TODAY'S TREATMENT:        [facility] Adult PT Treatment:                                                DATE: 08/14/23 Therapeutic Exercise: Towel scrunch/pull x 3 minutes  Ankle alphabet x 1 Ankle INVER/EVER towel slides  x 20  Ankle inver/ever strengthening green TB x 15 each  Ankle PF/DF strengthening GTB x 15 each  Seated arch lifts 2x10 Seated PF stretch using towel 2x45" Seated heel raise with 10# KB above knee 2x10 RLE   [facility] Adult PT Treatment:                                                DATE: 08/09/2023  Therapeutic Exercise: Towel scrunch/pull x 3 minutes  Ankle alphabet x 1 Ankle INVER/EVER towel slides  x 20  Ankle inver/ever strengthening red TB x 15 each  Ankle PF/DF strengthening GTB x 15 each   Manual Therapy: Joint mobilization MTJ  IASTYM along medial arch  Plantar fascia stretch   [facility] Adult PT Treatment:                                                 DATE: 08/02/2023  Therapeutic Exercise: Toe curl/spreading in supine x 20  Towel scrunch/pull x 20  Marble Pick ups (3/4) cup x 1  Ankle alphabet x 2 Ankle INVER/EVER towel slides  x 20  Ankle 4-way strengthening x 15 each direction red  TB   Manual Therapy: Joint mobilization MTJ  Plantar fascia stretch                                                                                                                      PATIENT EDUCATION:  Education details: reviewed initial home exercise program; discussion of POC, prognosis and goals for skilled PT   Person educated: Patient Education method: Explanation, Demonstration, and Handouts Education comprehension: verbalized understanding, returned demonstration, and needs further education  HOME EXERCISE PROGRAM: Access Code: [email or web address] URL: [email or web address] Date: 08/01/2023 Prepared by: Mauri Reading  Exercises - Seated Ankle Alphabet  - 2 x daily - 7 x weekly - 2 sets - Seated Toe Curl  - 2 x daily - 7 x weekly - 2 sets - 10 reps - Toe Spreading  - 2 x daily - 7 x weekly - 2 sets - 10 reps - Ankle Inversion Eversion Towel Slide  - 2 x daily - 7 x weekly - 2 sets - 10 reps - Seated Toe Towel Scrunches  - 2 x daily - 7 x weekly - 2 sets - 10 reps - Seated Arch Lifts  - 2 x daily - 7 x weekly - 2 sets - 10 reps  ASSESSMENT:  CLINICAL IMPRESSION: Patient reports to PT reporting no current pain and that she has been compliant with HEP. Session today focused on foot intrinsic strengthening and arch lifting, which she does very well with minimal cuing. Patient was able to tolerate all prescribed exercises with no adverse effects. Patient continues to benefit from skilled PT services and should be progressed as able to improve functional independence.    OBJECTIVE IMPAIRMENTS: Abnormal gait, decreased activity tolerance, difficulty walking, decreased strength, and pain.   ACTIVITY  LIMITATIONS: standing, squatting, stairs, and locomotion level  PARTICIPATION LIMITATIONS: meal prep, cleaning, laundry, driving, shopping, community activity, and occupation  PERSONAL FACTORS: Past/current experiences, Time since onset of injury/illness/exacerbation, and 1 comorbidity: depression/anxiety  are also affecting patient's functional outcome.   REHAB POTENTIAL: Fair    CLINICAL DECISION MAKING: Stable/uncomplicated  EVALUATION COMPLEXITY: Low   GOALS: Goals reviewed with patient? No  SHORT TERM GOALS: Target date: 08/18/2023   Patient will be independent with initial home program for ankle and foot strengthening activities.  Baseline: provided at eval  Goal status: MET Pt reports adherence 08/14/23   LONG TERM GOALS: Target date: 09/08/2023    Patient will report improved overall functional ability with FOTO score of 70 or greater  Baseline: 65 Goal status: INITIAL  2.  Patient will demonstrate 5/5 ankle strength.  Baseline: see objective measures  Goal status: INITIAL  3.  Patient will demonstrate ability to tolerate at least 25-30 minutes of weight bearing activities prior to requiring seated rest break.  Baseline: unable to tolerate >10 minutes Goal status: INITIAL  4.  Patient will demonstrate normal gait mechanics Baseline: see objective measures  Goal status: INITIAL    PLAN:  PT FREQUENCY: 1-2x/week  PT DURATION: 6  weeks  PLANNED INTERVENTIONS: 97164- PT Re-evaluation, 97110-Therapeutic exercises, 97530- Therapeutic activity, 97112- Neuromuscular re-education, 97535- Self Care, 44010- Manual therapy, Patient/Family education, Taping, Dry Needling, Joint mobilization, Cryotherapy, and Moist heat  PLAN FOR NEXT SESSION: manual therapy, TPDN, plantar fascia stretching, toe and instrinsic mm strengthening, MTJ mobilization, TPDN if indicated, ankle strengthening NWB => WB. Modalities as indicated    Berta Minor PTA 08/14/2023, 3:25 PM

## 2023-08-22 ENCOUNTER — Ambulatory Visit: Payer: 59

## 2023-08-22 DIAGNOSIS — M25571 Pain in right ankle and joints of right foot: Secondary | ICD-10-CM

## 2023-08-22 DIAGNOSIS — R262 Difficulty in walking, not elsewhere classified: Secondary | ICD-10-CM

## 2023-08-22 NOTE — Therapy (Addendum)
 PHYSICAL THERAPY DISCHARGE SUMMARY  Visits from Start of Care: 5  Patient is being discharged due to not returning since last visit (>60 days)    Arlester Bence, PT, DPT  12/27/2023 2:24 PM   OUTPATIENT PHYSICAL THERAPY TREATMENT NOTE     Patient Name: Michele Andersen MRN: [id number] DOB:[birth year], 47 y.o., female Today's Date: 08/22/2023   END OF SESSION:  PT End of Session - 08/22/23 1745     Visit Number 5    Date for PT Re-Evaluation 09/07/22    Authorization Type [employer]    PT Start Time 1745    PT Stop Time 1825    PT Time Calculation (min) 40 min    Activity Tolerance Patient tolerated treatment well    Behavior During Therapy [facility] for tasks assessed/performed             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   DILITATION & CURRETTAGE/HYSTROSCOPY WITH HYDROTHERMAL ABLATION N/A 07/21/2022   Procedure: DILATATION & CURETTAGE/HYSTEROSCOPY WITH HYDROTHERMAL ABLATION;  Surgeon: Terri Fester, MD;  Location: [facility];  Service: Gynecology;  Laterality: N/A;   ELBOW SURGERY Right  12/07/2015   @[facility]   by dr d. Hildy Lowers;    RIGHT EPICONDYLAR DEBRIDEMENT AND RADIAL NEUROPLASTY   WRIST GANGLION EXCISION Right 12/13/2019   @[facility]    by   dr d. 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   Allergic rhinitis 08/12/2010   DEPRESSION 07/27/2007   EUSTACHIAN TUBE DYSFUNCTION 07/27/2007   SINUSITIS- ACUTE-NOS 07/27/2007   ECZEMA 07/27/2007    PCP: Reginal Capra, MD  REFERRING PROVIDER: Jennefer Moats, DPM  REFERRING DIAG:  G57.61 (ICD-10-CM) - Morton's neuroma, right  M65.971 (ICD-10-CM) - Synovitis of right foot    THERAPY DIAG:  Pain in right ankle and joints of right foot  Difficulty in walking, not elsewhere classified  Rationale for Evaluation and Treatment: Rehabilitation  ONSET DATE: 09/22/2022  SUBJECTIVE:   SUBJECTIVE STATEMENT: Patient reports that she had minimal pain over the week in her ankle and foot.   PERTINENT HISTORY: PMHx included low back pain, depression/anxiety   PAIN:  Are you having pain?  Yes: NPRS scale: 5/10 Pain location: plantar surface of foot and distal-medial ankle Pain description: aching/soreness Aggravating factors: prolonged weight bearing (standing, walking)  Relieving factors: steroid injections, meloxicam    PRECAUTIONS: None  RED FLAGS: None   WEIGHT BEARING RESTRICTIONS:  No  FALLS:  Has patient fallen in last 6 months? No  LIVING ENVIRONMENT: Lives with: lives with their spouse Lives in: House/apartment Stairs: Yes: Internal: 14 steps; railing present and External: 3 steps Has following equipment at home: None  OCCUPATION: Desk Job with [employer]  PLOF: Independent  PATIENT GOALS: To have less pain with standing and walking   NEXT MD VISIT: not scheduled at time of eval    OBJECTIVE:  Note: Objective measures were completed at Evaluation unless otherwise noted.  DIAGNOSTIC FINDINGS:  06/10/2023 MRI Right Foot  IMPRESSION: 1. No acute injury of the right forefoot. 2. Mild arthritic changes of the medial hallux sesamoid-metatarsal articulation.  PATIENT SURVEYS:  FOTO 65 current, 70 predicted  08/22/23: 67%  COGNITION: Overall cognitive status: Within functional limits for tasks assessed     SENSATION: WFL   PALPATION: Mild tenderness to palpation   LOWER EXTREMITY ROM:  Active ROM Right eval Left eval  Hip flexion    Hip extension    Hip abduction    Hip adduction    Hip internal rotation    Hip external rotation    Knee flexion    Knee extension    Ankle dorsiflexion    Ankle plantarflexion    Ankle inversion    Ankle eversion     (Blank rows = not tested)  LOWER EXTREMITY MMT:  MMT Right eval Left eval  Ankle dorsiflexion  4+  Ankle plantarflexion    Ankle inversion  4  Ankle eversion  4   (Blank rows = not tested)  LOWER EXTREMITY SPECIAL TESTS:  Ankle special tests: Talar tilt test: negative and Great toe extension test: positive    GAIT: Distance walked: 50 ft Assistive device utilized: None Level of assistance: Complete Independence Comments: mild antalgic gait    TODAY'S TREATMENT:        [facility] Adult PT Treatment:                                                DATE: 08/22/23 Therapeutic Exercise: Towel scrunch/pull x 3 minutes  Ankle alphabet x 1 Ankle inver/ever strengthening green TB x 15 each  Ankle PF/DF strengthening GTB x 15 each  Seated arch lifts 2x10 Seated heel raise with 10# KB above knee 2x10 RLE Standing heel raises 2x10 Standing toe raises 2x10 NMRE: FT on Airex x30" Tandem on Airex x30" BIL SLS on RLE ~20"   [facility] Adult PT Treatment:                                                DATE: 08/14/23 Therapeutic Exercise: Towel scrunch/pull x 3 minutes  Ankle alphabet x 1 Ankle  INVER/EVER towel slides  x 20  Ankle inver/ever strengthening green TB x 15 each  Ankle PF/DF strengthening GTB x 15 each  Seated arch lifts 2x10 Seated PF stretch using towel 2x45" Seated heel raise with 10# KB above knee 2x10 RLE   [facility] Adult PT Treatment:  DATE: 08/09/2023  Therapeutic Exercise: Towel scrunch/pull x 3 minutes  Ankle alphabet x 1 Ankle INVER/EVER towel slides  x 20  Ankle inver/ever strengthening red TB x 15 each  Ankle PF/DF strengthening GTB x 15 each   Manual Therapy: Joint mobilization MTJ  IASTYM along medial arch  Plantar fascia stretch                                                                                                                      PATIENT EDUCATION:  Education details: reviewed initial home exercise program; discussion of POC, prognosis and goals for skilled PT   Person educated: Patient Education method: Explanation, Demonstration, and Handouts Education comprehension: verbalized understanding, returned demonstration, and needs further education  HOME EXERCISE PROGRAM: Access Code: [email or web address] URL: [email or web address] Date: 08/22/2023 Prepared by: Avana Kettering  Exercises - Seated Ankle Alphabet  - 2 x daily - 7 x weekly - 2 sets - Seated Toe Curl  - 2 x daily - 7 x weekly - 2 sets - 10 reps - Toe Spreading  - 2 x daily - 7 x weekly - 2 sets - 10 reps - Ankle Inversion Eversion Towel Slide  - 2 x daily - 7 x weekly - 2 sets - 10 reps - Seated Toe Towel Scrunches  - 2 x daily - 7 x weekly - 2 sets - 10 reps - Seated Arch Lifts  - 2 x daily - 7 x weekly - 2 sets - 10 reps - Heel Raises with Counter Support  - 1 x daily - 7 x weekly - 2 sets - 10 reps - Toe Raises with Counter Support  - 1 x daily - 7 x weekly - 2 sets - 10 reps  ASSESSMENT:  CLINICAL IMPRESSION: Patient presents to PT reporting no current pain. She would like to temporarily pause PT until her  new insurance information is obtained. Updated HEP with patient demonstrating understanding of each exercise. Re-administered FOTO this session with patient progressing towards goal. She was able to tolerate introduction of standing exercises today with minimal increase in pain. Patient continues to benefit from skilled PT services and should be progressed as able to improve functional independence.    OBJECTIVE IMPAIRMENTS: Abnormal gait, decreased activity tolerance, difficulty walking, decreased strength, and pain.   ACTIVITY LIMITATIONS: standing, squatting, stairs, and locomotion level  PARTICIPATION LIMITATIONS: meal prep, cleaning, laundry, driving, shopping, community activity, and occupation  PERSONAL FACTORS: Past/current experiences, Time since onset of injury/illness/exacerbation, and 1 comorbidity: depression/anxiety  are also affecting patient's functional outcome.   REHAB POTENTIAL: Fair    CLINICAL DECISION MAKING: Stable/uncomplicated  EVALUATION COMPLEXITY: Low   GOALS: Goals reviewed with patient? No  SHORT TERM GOALS: Target date: 08/18/2023   Patient will be independent with initial home program for ankle and foot strengthening activities.  Baseline: provided at eval  Goal status: MET Pt reports adherence 08/14/23   LONG TERM GOALS: Target date: 09/08/2023    Patient will report improved overall functional  ability with FOTO score of 70 or greater  Baseline: 65 Goal status: Progressing  08/22/23: 67%  2.  Patient will demonstrate 5/5 ankle strength.  Baseline: see objective measures  Goal status: Progressing   3.  Patient will demonstrate ability to tolerate at least 25-30 minutes of weight bearing activities prior to requiring seated rest break.  Baseline: unable to tolerate >10 minutes Goal status: Progressing 08/22/23: Pt reports 15 minutes before needing to sit  4.  Patient will demonstrate normal gait mechanics Baseline: see objective measures   Goal status: Progressing    PLAN:  PT FREQUENCY: 1-2x/week  PT DURATION: 6 weeks  PLANNED INTERVENTIONS: 97164- PT Re-evaluation, 97110-Therapeutic exercises, 97530- Therapeutic activity, 97112- Neuromuscular re-education, 97535- Self Care, 16109- Manual therapy, Patient/Family education, Taping, Dry Needling, Joint mobilization, Cryotherapy, and Moist heat  PLAN FOR NEXT SESSION: manual therapy, TPDN, plantar fascia stretching, toe and instrinsic mm strengthening, MTJ mobilization, TPDN if indicated, ankle strengthening NWB => WB. Modalities as indicated    Janyia Kettering PTA 08/22/2023, 6:30 PM

## 2023-11-06 ENCOUNTER — Other Ambulatory Visit: Payer: Self-pay | Admitting: Internal Medicine

## 2024-02-08 ENCOUNTER — Ambulatory Visit: Admitting: Internal Medicine

## 2024-02-08 ENCOUNTER — Other Ambulatory Visit: Payer: Self-pay | Admitting: Internal Medicine

## 2024-02-08 VITALS — BP 148/92 | HR 60 | Temp 97.8°F | Ht 63.0 in | Wt 144.2 lb

## 2024-02-08 DIAGNOSIS — M79604 Pain in right leg: Secondary | ICD-10-CM | POA: Diagnosis not present

## 2024-02-08 DIAGNOSIS — Z0000 Encounter for general adult medical examination without abnormal findings: Secondary | ICD-10-CM

## 2024-02-08 DIAGNOSIS — R030 Elevated blood-pressure reading, without diagnosis of hypertension: Secondary | ICD-10-CM | POA: Diagnosis not present

## 2024-02-08 DIAGNOSIS — Z79899 Other long term (current) drug therapy: Secondary | ICD-10-CM

## 2024-02-08 DIAGNOSIS — M5431 Sciatica, right side: Secondary | ICD-10-CM | POA: Diagnosis not present

## 2024-02-08 NOTE — Progress Notes (Signed)
 Lab pre cpe   2025

## 2024-02-08 NOTE — Patient Instructions (Addendum)
 I will do referral to  sports medicine . I agree this seem mechanical   but not sure about the calf extension.   Bp is up today .   Take blood pressure readings twice a day for 3-5 days  and then periodically .To ensure below 140/90   .Send in readings     Make  appt for CPE and lab before.  This summer .

## 2024-02-08 NOTE — Progress Notes (Signed)
 Chief Complaint  Patient presents with   Back Pain    Pt c/o back pain on lower side of Right. Pt thinks its from sciatica. Noticed sx since mid march. Pain constant.  feels possibly inflamed. Work with chiropractor every 2-3 wks.    Leg Pain    Pt c/o pain R calf.     HPI: Michele Andersen 48 y.o. come in for  above   Couple of pains uncertain if all  related .   See above  battling right buttocks post thigh pain felt to be sciatica but then right later leg nerve pain and now  some in calf    no new injury and no swelling .  Using topical nsaid . Worse when walking .    Seeing chiro since March  needs advice about what next.  HX  Pain in  foot  nerve  last year )Had PT last year and pod of rmortons neuroma . R arm  fracture  in jan after fall down stairs and had cast and then off  since fracture .  Last visit with me video for anxiety disorder  6 24 Doing well on lexapro  and ocass  hydroxyzine   about once a week now.   ROS: See pertinent positives and negatives per HPI.  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   Wears contact lenses     Family History  Problem Relation Age of Onset   Hyperlipidemia Mother    Diabetes Father    Hyperlipidemia Father     Social History   Socioeconomic History   Marital status: Married    Spouse name: Not on file   Number of children: Not on file   Years of education: Not on file   Highest education level: Bachelor's degree (e.g., BA, AB, BS)  Occupational History   Not on file  Tobacco Use   Smoking status: Former    Current packs/day: 0.00    Average packs/day: 0.5 packs/day for 4.0 years (2.0 ttl pk-yrs)    Types: Cigarettes     Start date: 2000    Quit date: 2004    Years since quitting: 21.4   Smokeless tobacco: Never  Vaping Use   Vaping status: Never Used  Substance and Sexual Activity   Alcohol use: Yes    Comment: occ    Drug use: Never   Sexual activity: Not on file  Other Topics Concern   Not on file  Social History Narrative   Not on file   Social Drivers of Health   Financial Resource Strain: Low Risk  (02/08/2024)   Overall Financial Resource Strain (CARDIA)    Difficulty of Paying Living Expenses: Not very hard  Food Insecurity: No Food Insecurity (02/08/2024)   Hunger Vital Sign    Worried About Running Out of Food in the Last Year: Never true    Ran Out of Food in the Last Year: Never true  Transportation Needs: No Transportation Needs (02/08/2024)   PRAPARE - Administrator, Civil Service (Medical): No    Lack of Transportation (Non-Medical): No  Physical Activity: Insufficiently  Active (02/08/2024)   Exercise Vital Sign    Days of Exercise per Week: 2 days    Minutes of Exercise per Session: 10 min  Stress: Stress Concern Present (02/08/2024)   [employer] of Occupational Health - Occupational Stress Questionnaire    Feeling of Stress: To some extent  Social Connections: Moderately Isolated (02/08/2024)   Social Connection and Isolation Panel    Frequency of Communication with Friends and Family: Once a week    Frequency of Social Gatherings with Friends and Family: Once a week    Attends Religious Services: More than 4 times per year    Active Member of [employer] or Organizations: No    Attends Engineer, structural: Not on file    Marital Status: Married    Outpatient Medications Prior to Visit  Medication Sig Dispense Refill   cetirizine (ZYRTEC) 10 MG tablet Take 10 mg by mouth daily.     escitalopram  (LEXAPRO ) 10 MG tablet TAKE 1 TABLET(10 MG) BY MOUTH DAILY 90 tablet 2   hydrOXYzine  (VISTARIL ) 25 MG capsule TAKE 1 CAPSULE(25 MG) BY MOUTH EVERY 8 HOURS  AS NEEDED FOR ANXIETY 30 capsule 1   Multiple Vitamins-Minerals (MULTIVITAMIN WITH MINERALS) tablet Take 1 tablet by mouth daily.     triamcinolone  cream (KENALOG ) 0.1 % Apply 1 Application topically 2 (two) times daily. For eczema 30 g 1   ibuprofen  (ADVIL ) 200 MG tablet Take 3 tablets (600 mg total) by mouth every 6 (six) hours as needed. 30 tablet 0   meloxicam  (MOBIC ) 15 MG tablet TAKE 1 TABLET(15 MG) BY MOUTH DAILY (Patient taking differently: daily as needed for pain.) 30 tablet 0   No facility-administered medications prior to visit.     EXAM:  BP (!) 148/92 (BP Location: Right Arm, Patient Position: Sitting, Cuff Size: Normal)   Pulse 60   Temp 97.8 F (36.6 C) (Oral)   Ht 5' 3 (1.6 m)   Wt 144 lb 3.2 oz (65.4 kg)   SpO2 98%   BMI 25.54 kg/m   Body mass index is 25.54 kg/m.  GENERAL: vitals reviewed and listed above, alert, oriented, appears well hydrated and in no acute distress HEENT: atraumatic, conjunctiva  clear, no obvious abnormalities on inspection of external nose and ears NECK: no obvious masses on inspection palpation  MS: moves all extremities without noticeable focal  abnormality No midline  spine  pain but   r buttocks area  ?  Then leg no atrophy or redness   reports  right lateral to knee fib tib area and calf  but no point local area of concern    no weakness and normal appearing gait  PSYCH: pleasant and cooperative, no obvious depression or anxiety Lab Results  Component Value Date   WBC 8.7 07/21/2022   HGB 14.3 07/21/2022   HCT 43.3 07/21/2022   PLT 348 07/21/2022   GLUCOSE 90 05/05/2022   CHOL 159 05/05/2022   TRIG 101.0 05/05/2022   HDL 42.70 05/05/2022   LDLCALC 96 05/05/2022   ALT 12 05/05/2022   AST 14 05/05/2022   NA 139 05/05/2022   K 4.1 05/05/2022   CL 105 05/05/2022   CREATININE 0.65 05/05/2022   BUN 12 05/05/2022   CO2 26 05/05/2022   TSH 2.12 05/05/2022   BP Readings from Last 3 Encounters:  02/08/24 (!) 148/92  07/21/22  135/86  05/11/22 132/80    ASSESSMENT AND PLAN:  Discussed the following assessment and plan:  Pain of right  lower extremity  Sciatica of right side  Medication management - cont lexapro   Elevated BP without diagnosis of hypertension Uncertain is related agree with pat probably mechanical / nerve cause  but consider  vascular with calf pain but has been going on for a while.  Refer to [facility].  BP elevated  today   plan more readings at home  to get baseline Plan cpe in summer and  lab previsit   placing orders today . -Patient advised to return or notify health care team  if  new concerns arise.  Patient Instructions  I will do referral to  sports medicine . I agree this seem mechanical   but not sure about the calf extension.   Bp is up today .   Take blood pressure readings twice a day for 3-5 days  and then periodically .To ensure below 140/90   .Send in readings     Make  appt for CPE and lab before.  This summer .      Divit Stipp K. Christiona Siddique M.D.

## 2024-02-13 ENCOUNTER — Ambulatory Visit: Admitting: Internal Medicine

## 2024-02-14 ENCOUNTER — Encounter: Payer: Self-pay | Admitting: Family Medicine

## 2024-02-14 ENCOUNTER — Ambulatory Visit (INDEPENDENT_AMBULATORY_CARE_PROVIDER_SITE_OTHER)

## 2024-02-14 ENCOUNTER — Ambulatory Visit (INDEPENDENT_AMBULATORY_CARE_PROVIDER_SITE_OTHER): Admitting: Family Medicine

## 2024-02-14 VITALS — BP 102/68 | HR 73 | Ht 63.0 in | Wt 147.0 lb

## 2024-02-14 DIAGNOSIS — G8929 Other chronic pain: Secondary | ICD-10-CM | POA: Diagnosis not present

## 2024-02-14 DIAGNOSIS — M5441 Lumbago with sciatica, right side: Secondary | ICD-10-CM

## 2024-02-14 DIAGNOSIS — G5701 Lesion of sciatic nerve, right lower limb: Secondary | ICD-10-CM

## 2024-02-14 NOTE — Patient Instructions (Addendum)
 Thank you for coming in today.   I've referred you to Physical Therapy.  Let us  know if you don't hear from them in one week.   Check back in 6 weeks with Dr. Leonce or Dr. Claudene  I will be out of the office starting August 1st, for about 6 weeks

## 2024-02-14 NOTE — Progress Notes (Signed)
   I, Leotis Batter, CMA acting as a scribe for Artist Lloyd, MD.  Michele Andersen is a 48 y.o. female who presents to [facility] at [facility] today for low back and R lower leg pain ongoing since mid-March. Pt locates pain to right side lower back radiating into the R LE. Experiencing more pain in the calf. Denies increased warmth, swelling, erythema. Sx have responded well with Chiro treatments and massage. Broke humerus in Jan, feels that sx were exacerbated just before coming out of the cast, was unable to do self body work.   Radiating pain: R LE LE numbness/tingling: denies LE weakness: intermittent Aggravates: sx are constant, ambulation Treatments tried: topical NSAIDs, Chiropractic adjustments  Pertinent review of systems: No fevers or chills  Relevant historical information: Anxiety   Exam:  BP 102/68   Pulse 73   Ht 5' 3 (1.6 m)   Wt 147 lb (66.7 kg)   SpO2 100%   BMI 26.04 kg/m  General: Well Developed, well nourished, and in no acute distress.   MSK: L-spine: Normal appearing Nontender palpation spinal midline.  Decreased cervical motion. Upper extremity strength is intact. Reflexes are intact. Positive right-sided slump test.    Lab and Radiology Results  X-ray images lumbar spine obtained today personally and independently interpreted. No significant degenerative changes.  Normal alignment no acute fractures. Await formal radiology review.     Assessment and Plan: 48 y.o. female with chronic right low back pain with right L5 radiculopathy.  There may be a component of piriformis syndrome as well.  Plan for trial of physical therapy.  Check back with one of my partners in about 6 weeks.  I will be away on medical leave during this time period.  If not improved consider MRI lumbar spine.  Consider alternate diagnoses piriformis syndrome as well.   PDMP not reviewed this encounter. Orders Placed This Encounter  Procedures   DG  Lumbar Spine 2-3 Views    Standing Status:   Future    Number of Occurrences:   1    Expiration Date:   03/15/2024    Reason for Exam (SYMPTOM  OR DIAGNOSIS REQUIRED):   LBP    Preferred imaging location?:   [place]    Is patient pregnant?:   No   Ambulatory referral to Physical Therapy    Referral Priority:   Routine    Referral Type:   Physical Medicine    Referral Reason:   Specialty Services Required    Requested Specialty:   Physical Therapy    Number of Visits Requested:   1   No orders of the defined types were placed in this encounter.    Discussed warning signs or symptoms. Please see discharge instructions. Patient expresses understanding.   The above documentation has been reviewed and is accurate and complete Artist Lloyd, M.D.

## 2024-02-19 ENCOUNTER — Ambulatory Visit: Payer: Self-pay | Admitting: Family Medicine

## 2024-02-19 NOTE — Progress Notes (Signed)
Lumbar spine x-ray shows a little bit of arthritis.

## 2024-02-21 ENCOUNTER — Ambulatory Visit: Admitting: Internal Medicine

## 2024-03-08 ENCOUNTER — Other Ambulatory Visit: Payer: Self-pay

## 2024-03-08 ENCOUNTER — Encounter: Payer: Self-pay | Admitting: Physical Therapy

## 2024-03-08 ENCOUNTER — Ambulatory Visit: Admitting: Physical Therapy

## 2024-03-08 DIAGNOSIS — M6281 Muscle weakness (generalized): Secondary | ICD-10-CM | POA: Diagnosis not present

## 2024-03-08 DIAGNOSIS — R293 Abnormal posture: Secondary | ICD-10-CM

## 2024-03-08 DIAGNOSIS — M5459 Other low back pain: Secondary | ICD-10-CM

## 2024-03-08 DIAGNOSIS — M79661 Pain in right lower leg: Secondary | ICD-10-CM

## 2024-03-08 DIAGNOSIS — R29898 Other symptoms and signs involving the musculoskeletal system: Secondary | ICD-10-CM

## 2024-03-08 NOTE — Therapy (Signed)
 OUTPATIENT PHYSICAL THERAPY THORACOLUMBAR EVALUATION   Patient Name: Michele Andersen MRN: [id number] DOB:01-09-1976, 48 y.o., female Today's Date: 03/08/2024  END OF SESSION:  PT End of Session - 03/08/24 1349     Visit Number 1    Number of Visits 9    Date for PT Re-Evaluation 05/03/24    Authorization Type [employer]    Authorization Time Period 03/08/24 to 05/03/24    Authorization - Number of Visits 20    PT Start Time 1304    PT Stop Time 1343    PT Time Calculation (min) 39 min    Activity Tolerance Patient tolerated treatment well    Behavior During Therapy [facility] for tasks assessed/performed          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   DILITATION & CURRETTAGE/HYSTROSCOPY WITH HYDROTHERMAL ABLATION N/A 07/21/2022   Procedure: DILATATION & CURETTAGE/HYSTEROSCOPY WITH HYDROTHERMAL ABLATION;  Surgeon: Barbette Knock, MD;  Location: [facility];  Service: Gynecology;  Laterality: N/A;   ELBOW SURGERY Right 12/07/2015   @[facility]   by dr d. sebastian;    RIGHT EPICONDYLAR DEBRIDEMENT AND RADIAL  NEUROPLASTY   WRIST GANGLION EXCISION Right 12/13/2019   @[facility]    by   dr d. 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   Allergic rhinitis 08/12/2010   DEPRESSION 07/27/2007   EUSTACHIAN TUBE DYSFUNCTION 07/27/2007   SINUSITIS- ACUTE-NOS 07/27/2007   ECZEMA 07/27/2007    PCP: Charlett Howard MD   REFERRING PROVIDER: Joane Artist RAMAN, MD  REFERRING DIAG: Diagnosis M54.41,G89.29 (ICD-10-CM) - Chronic low back pain with right-sided sciatica, unspecified back pain laterality  Rationale for Evaluation and Treatment: Rehabilitation  THERAPY DIAG:  Other low back pain  Pain in right lower leg  Muscle weakness (generalized)  Abnormal posture  Other symptoms and signs involving the musculoskeletal system  ONSET DATE: chronic   SUBJECTIVE:  SUBJECTIVE STATEMENT:  I've had problems for years in my lower back, usually I can work it out myself. See a chiropractor monthly for maintenance. Clemens down steps in January and broke my arm, wasn't able to stretch regularly. Having pain in my calf but it seems separate from sciatica pain. Chiropractor  told me calf is very tight too. I've done stretches in my own home, tried foam rollers, tennis balls, TENS, ice, heat, voltaren, chiropractic adjustments, traction.   PERTINENT HISTORY:  See above   PAIN:  Are you having pain? Yes: NPRS scale: 4/10 Pain location: R calf and R lower back Pain description: calf: tight, shooting, burning, squeezing, ache; back: tight, constant irritation Aggravating factors: calf: walking, standing, sitting,  sometimes no reason. Back:sitting for >30 minutes, cleaning  Relieving factors: calf:massage but it moves and I can't get in there, voltaren; back, laying flat, decompressing back   PRECAUTIONS: None  RED FLAGS: None   WEIGHT BEARING RESTRICTIONS: No  FALLS:  Has patient fallen in last 6 months? 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 Morene Mace is taking over her care during this period)  OBJECTIVE:  Note: Objective measures were completed at Evaluation unless otherwise noted.  DIAGNOSTIC FINDINGS:   CLINICAL DATA:  Low back pain for years.   EXAM: LUMBAR SPINE - 2-3 VIEW   COMPARISON:  None Available.   FINDINGS: There is no evidence of lumbar spine fracture. Minimal curvature of spine. Minimal anterior spurring noted at L3 and L4. Intervertebral disc spaces are maintained.   IMPRESSION: Minimal degenerative joint changes of lumbar spine.  PATIENT SURVEYS:  PSFS: THE PATIENT SPECIFIC FUNCTIONAL SCALE  Place score of 0-10 (0 = unable to perform activity and 10 = able to perform activity at the same level as before injury or problem)  Activity Date: 03/08/24 Eval     Sitting over 30 minutes  5    2. Walking over 20 minutes  5    3.Turning head to the right when bending  4    4. Twisting  5    Total Score 4.75      Total Score = Sum of activity scores/number of activities  Minimally Detectable Change: 3 points (for single activity); 2 points (for average score)  [facility] (nd). The Patient Specific Functional Scale .  Retrieved from [email or web address]   COGNITION: Overall cognitive status: Within functional limits for tasks assessed     SENSATION: Some reports of very short zips of electricity from lateral calf down, rarely happens from hip/back down   MUSCLE LENGTH:  Quads OK, hip flexors OK, HS and piriformis OK B, SIJ in alignment per basic screen   POSTURE: rounded shoulders, forward head, decreased lumbar lordosis, and increased thoracic kyphosis  PALPATION: Deep mm spasms noted lateral calf R LE   LUMBAR ROM:   AROM eval  Flexion Full ROM  Extension Full ROM but stiff feeling to her   Right lateral flexion WNL   Left lateral flexion WNL   Right rotation 50% limited   Left rotation 50% limited    (Blank  rows = not tested)    LOWER EXTREMITY MMT:    MMT Right eval Left eval  Hip flexion 4+ 4+  Hip extension 3- 3  Hip abduction 4+ 3+  Hip adduction    Hip internal rotation    Hip external rotation    Knee flexion 4 4  Knee extension 4+ 4+  Ankle dorsiflexion    Ankle plantarflexion    Ankle inversion    Ankle eversion     (Blank rows = not tested)  LUMBAR SPECIAL TESTS:  SIJ in alignment, slump test (-) but does seem to have some mm tightness      TREATMENT DATE:   03/08/24  Eval, POC, HEP practice and education as below  Nustep seat 6 L5x6 minutes BLEs only                                                                                                                                   PATIENT EDUCATION:  Education details: exam findings, POC, HEP  Person educated: Patient Education method: Programmer, multimedia, Demonstration, and Handouts Education comprehension: verbalized understanding, returned demonstration, and needs further education  HOME EXERCISE PROGRAM:  Access Code: [email or web address] URL: [email or web address] Date: 03/08/2024 Prepared by: Josette Rough  Exercises - Supine Bridge with  Resistance Band  - 2 x daily - 7 x weekly - 1 sets - 10 reps - 2 seconds  hold - Supine Posterior Pelvic Tilt  - 2 x daily - 7 x weekly - 1 sets - 10 reps - 3-5 seconds  hold - Supine Lower Trunk Rotation  - 2 x daily - 7 x weekly - 1 sets - 10 reps - 3-5 seconds  hold  ASSESSMENT:  CLINICAL IMPRESSION: Patient is a 48 y.o. F who was seen today for physical therapy evaluation and treatment for Diagnosis M54.41,G89.29 (ICD-10-CM) - Chronic low back pain with right-sided sciatica, unspecified back pain laterality. Objectives as above, of note she does have a neuroma on the R that never received full course of skilled PT care for this- does have some deep spasms in lateral R calf musculature  which might be related to changes in gait pattern from this issue. Seeing a chiropractor once a month, advised her that it would be beneficial to put this on hold for sake of making sure insurance will cover PT services, as well as therapist being able to clearly determine what interventions are working/not working.   OBJECTIVE IMPAIRMENTS: Abnormal gait, decreased mobility, difficulty walking, decreased strength, increased fascial restrictions, increased muscle spasms, impaired flexibility, postural dysfunction, and pain.   ACTIVITY LIMITATIONS: carrying, lifting, bending, sitting, standing, squatting, sleeping, stairs, transfers, and locomotion level  PARTICIPATION LIMITATIONS: meal prep, cleaning, laundry, shopping, community activity, occupation, and yard work  PERSONAL FACTORS: Education, Financial risk analyst, Past/current experiences, Profession, Social background, and Time since onset of injury/illness/exacerbation are also affecting patient's functional outcome.   REHAB POTENTIAL: Good  CLINICAL DECISION MAKING: Stable/uncomplicated  EVALUATION COMPLEXITY: Low   GOALS: Goals reviewed with patient? No  SHORT TERM GOALS: Target date: 04/05/2024      Will be compliant with appropriate progressive HEP GOAL  STATUS: Initial   2. Will demonstrate improved postural awareness with all functional tasks, use of ergonomic aides PRN/as desired GOAL STATUS: Initial   3. Will demonstrate good functional biomechanics for bed mobility and floor to waist lifting mechanics  GOAL STATUS: Initial   4. Lumbar AROM to be full and without feelings of stiffness or increased pain GOAL STATUS: Initial     LONG TERM GOALS: Target date: 05/03/2024    MMT to have improved by one grade all weak groups GOAL STATUS: Initial  2. Mm flexibility and spasms to have improved by at least 50% in order to improve functional movement patterns and for pain control GOAL STATUS: Initial  3. Pain to be no more than 1/10 with all functional activities including exercise as desired  GOAL STATUS: Initial   4. Will have improved score on PSFS  by at least 3 points to show improved QOL and subjective perception of condition    PLAN:  PT FREQUENCY: 1x/week  PT DURATION: 8 weeks  PLANNED INTERVENTIONS: 97750- Physical Performance Testing, 97110-Therapeutic exercises, 97530- Therapeutic activity, [id number]- Neuromuscular re-education, 97535- Self Care, 02859- Manual therapy, [id number]- Aquatic Therapy, and 20560 (1-2 muscles), 20561 (3+ muscles)- Dry Needling.  PLAN FOR NEXT SESSION: lumbar mobility, core and hip strength, manual to lateral calf as appropriate (was asking about dry needling, aware of extra charge for this and seems agreeable), postural training, assess gait and need for orthotic PRN   Josette Rough, PT, DPT 03/08/24 1:51 PM

## 2024-03-13 NOTE — Therapy (Signed)
 OUTPATIENT PHYSICAL THERAPY THORACOLUMBAR TREATMENT   Patient Name: Michele Andersen MRN: [id number] DOB:09-22-1975, 48 y.o., female Today's Date: 03/15/2024  END OF SESSION:  PT End of Session - 03/15/24 0807     Visit Number 2    Number of Visits 9    Date for PT Re-Evaluation 05/03/24    Authorization Type UHC    Authorization Time Period 03/08/24 to 05/03/24    Authorization - Number of Visits 20    PT Start Time 1506    PT Stop Time 1544    PT Time Calculation (min) 38 min    Activity Tolerance Patient tolerated treatment well    Behavior During Therapy [facility] for tasks assessed/performed           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   DILITATION & CURRETTAGE/HYSTROSCOPY WITH HYDROTHERMAL ABLATION N/A 07/21/2022   Procedure: DILATATION & CURETTAGE/HYSTEROSCOPY WITH HYDROTHERMAL ABLATION;  Surgeon: Barbette Knock, MD;  Location: [facility];  Service: Gynecology;  Laterality: N/A;   ELBOW SURGERY Right 12/07/2015   @[facility]   by dr d. sebastian;    RIGHT EPICONDYLAR DEBRIDEMENT AND RADIAL  NEUROPLASTY   WRIST GANGLION EXCISION Right 12/13/2019   @[facility]    by   dr d. 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   Allergic rhinitis 08/12/2010   DEPRESSION 07/27/2007   EUSTACHIAN TUBE DYSFUNCTION 07/27/2007   SINUSITIS- ACUTE-NOS 07/27/2007   ECZEMA 07/27/2007    PCP: Charlett Howard MD   REFERRING PROVIDER: Joane Artist RAMAN, MD  REFERRING DIAG: Diagnosis M54.41,G89.29 (ICD-10-CM) - Chronic low back pain with right-sided sciatica, unspecified back pain laterality  Rationale for Evaluation and Treatment: Rehabilitation  THERAPY DIAG:  Other low back pain  Pain in right lower leg  Muscle weakness (generalized)  Abnormal posture  Other symptoms and signs involving the musculoskeletal system  ONSET DATE: chronic   SUBJECTIVE:  SUBJECTIVE STATEMENT: Pt reports compliance with HEP and feels its helping with pain.  Still getting pain with walking> 10 minutes.   Eval: I've had problems for years in my lower back, usually I can work it out myself. See a chiropractor monthly for maintenance. Clemens down steps in January and broke my arm, wasn't able to stretch regularly. Having pain in my calf but it seems separate from sciatica pain. Chiropractor  told me calf is very tight too. I've done stretches in my own home, tried foam rollers, tennis balls, TENS, ice, heat, voltaren, chiropractic adjustments, traction.   PERTINENT HISTORY:  See above   PAIN:  Are you having pain? Yes: NPRS scale: 3/10 Pain location: R calf and R lower back Pain description: calf: tight, shooting,  burning, squeezing, ache; back: tight, constant irritation Aggravating factors: calf: walking, standing, sitting, sometimes no reason. Back:sitting for >30 minutes, cleaning  Relieving factors: calf:massage but it moves and I can't get in there, voltaren; back, laying flat, decompressing back   PRECAUTIONS: None  RED FLAGS: None   WEIGHT BEARING RESTRICTIONS: No  FALLS:  Has patient fallen in last 6 months? 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 Morene Mace is taking over her care during this period)  OBJECTIVE:  Note: Objective measures were completed at Evaluation unless otherwise noted.  DIAGNOSTIC FINDINGS:   CLINICAL DATA:  Low back pain for years.   EXAM: LUMBAR SPINE - 2-3 VIEW   COMPARISON:  None Available.   FINDINGS: There is no evidence of lumbar spine fracture. Minimal curvature of spine. Minimal anterior spurring noted at L3 and L4. Intervertebral disc spaces are maintained.   IMPRESSION: Minimal degenerative joint changes of lumbar spine.  PATIENT SURVEYS:  PSFS: THE PATIENT SPECIFIC FUNCTIONAL SCALE  Place score of 0-10 (0 = unable to perform activity and 10 = able to perform activity at the same level as before injury or problem)  Activity Date: 03/08/24 Eval     Sitting over 30 minutes  5    2. Walking over 20 minutes  5    3.Turning head to the right when bending  4    4. Twisting  5    Total Score 4.75      Total Score = Sum of activity scores/number of activities  Minimally Detectable Change: 3 points (for single  activity); 2 points (for average score)  [facility] (nd). The Patient Specific Functional Scale . Retrieved from [email or web address]   COGNITION: Overall cognitive status: Within functional limits for tasks assessed     SENSATION: Some reports of very short zips of electricity from lateral calf down, rarely happens from hip/back down   MUSCLE LENGTH:  Quads OK, hip flexors OK, HS and piriformis OK B, SIJ in alignment per basic screen   POSTURE: rounded shoulders, forward head, decreased lumbar lordosis, and increased thoracic kyphosis  PALPATION: Deep mm spasms noted lateral calf R LE   LUMBAR ROM:   AROM eval  Flexion Full ROM  Extension Full ROM but stiff feeling to her   Right lateral flexion WNL  Left lateral flexion WNL   Right rotation 50% limited   Left rotation 50% limited    (Blank rows = not tested)    LOWER EXTREMITY MMT:    MMT Right eval Left eval  Hip flexion 4+ 4+  Hip extension 3- 3  Hip abduction 4+ 3+  Hip adduction    Hip internal rotation    Hip external rotation    Knee flexion 4 4  Knee extension 4+ 4+  Ankle dorsiflexion    Ankle plantarflexion    Ankle inversion    Ankle eversion     (Blank rows = not tested)  LUMBAR SPECIAL TESTS:  SIJ in alignment, slump test (-) but does seem to have some mm tightness      TREATMENT DATE: 03/13/24   There ex Nustep seat 6 L5x6 minutes BLEs only Supine Lower Trunk Rotation  - 2 x daily - 7 x weekly - 1 sets - 10 reps - 3-5 seconds  hold Incline board stretch 3x30 sec Neuro Re ed Supine Bridge with Resistance Band  green 3x10 4 sec hold Supine Posterior Pelvic Tilt  - 2 sets 10 reps - 3-5 seconds hold Supine Hip abduction T band Green 3x10 Supine heel lifts with TA activation  2x10 Supine hip add ball squeeze 10x10sec Supine hip abd Tband 3x10 green    03/08/24  Eval, POC, HEP practice and education as  below  Nustep seat 6 L5x6 minutes BLEs only                                                                                                                                   PATIENT EDUCATION:  Education details: exam findings, POC, HEP  Person educated: Patient Education method: Programmer, multimedia, Demonstration, and Handouts Education comprehension: verbalized understanding, returned demonstration, and needs further education  HOME EXERCISE PROGRAM:  Access Code: [email or web address] URL: [email or web address] Date: 03/08/2024 Prepared by: Josette Rough  Exercises - Supine Bridge with Resistance Band  - 2 x daily - 7 x weekly - 1 sets - 10 reps - 2 seconds  hold - Supine Posterior Pelvic Tilt  - 2 x daily - 7 x weekly - 1 sets - 10 reps - 3-5 seconds  hold - Supine Lower Trunk Rotation  - 2 x daily - 7 x weekly - 1 sets - 10 reps - 3-5 seconds  hold  ASSESSMENT:  CLINICAL IMPRESSION: Pt needed VC for TA activation for stabilization in supine with exercises.  Pt demonstrated understanding.     Eval:Patient is a 48 y.o. F who was seen today for physical therapy evaluation and treatment for Diagnosis M54.41,G89.29 (ICD-10-CM) - Chronic low back pain with right-sided sciatica, unspecified back pain laterality. Objectives as above, of note she does have a neuroma on the R that never received full course of skilled PT care for this- does have some deep spasms in lateral R calf musculature  which might be related  to changes in gait pattern from this issue. Seeing a chiropractor once a month, advised her that it would be beneficial to put this on hold for sake of making sure insurance will cover PT services, as well as therapist being able to clearly determine what interventions are working/not working.   OBJECTIVE IMPAIRMENTS: Abnormal gait, decreased mobility, difficulty walking, decreased strength, increased fascial restrictions, increased muscle spasms, impaired flexibility, postural  dysfunction, and pain.   ACTIVITY LIMITATIONS: carrying, lifting, bending, sitting, standing, squatting, sleeping, stairs, transfers, and locomotion level  PARTICIPATION LIMITATIONS: meal prep, cleaning, laundry, shopping, community activity, occupation, and yard work  PERSONAL FACTORS: Education, Financial risk analyst, Past/current experiences, Profession, Social background, and Time since onset of injury/illness/exacerbation are also affecting patient's functional outcome.   REHAB POTENTIAL: Good  CLINICAL DECISION MAKING: Stable/uncomplicated  EVALUATION COMPLEXITY: Low   GOALS: Goals reviewed with patient? No  SHORT TERM GOALS: Target date: 04/05/2024      Will be compliant with appropriate progressive HEP GOAL STATUS: Initial   2. Will demonstrate improved postural awareness with all functional tasks, use of ergonomic aides PRN/as desired GOAL STATUS: Initial   3. Will demonstrate good functional biomechanics for bed mobility and floor to waist lifting mechanics  GOAL STATUS: Initial   4. Lumbar AROM to be full and without feelings of stiffness or increased pain GOAL STATUS: Initial     LONG TERM GOALS: Target date: 05/03/2024    MMT to have improved by one grade all weak groups GOAL STATUS: Initial  2. Mm flexibility and spasms to have improved by at least 50% in order to improve functional movement patterns and for pain control GOAL STATUS: Initial  3. Pain to be no more than 1/10 with all functional activities including exercise as desired  GOAL STATUS: Initial   4. Will have improved score on PSFS  by at least 3 points to show improved QOL and subjective perception of condition    PLAN:  PT FREQUENCY: 1x/week  PT DURATION: 8 weeks  PLANNED INTERVENTIONS: 97750- Physical Performance Testing, 97110-Therapeutic exercises, 97530- Therapeutic activity, [id number]- Neuromuscular re-education, 97535- Self Care, 02859- Manual therapy, [id number]- Aquatic Therapy, and 20560 (1-2  muscles), 20561 (3+ muscles)- Dry Needling.  PLAN FOR NEXT SESSION: manual to lateral calf as appropriate (was asking about dry needling, aware of extra charge for this and seems agreeable), postural training, assess gait and need for orthotic PRN   Burnard Meth, PT 03/15/24  8:09 AM

## 2024-03-14 ENCOUNTER — Ambulatory Visit

## 2024-03-14 DIAGNOSIS — R29898 Other symptoms and signs involving the musculoskeletal system: Secondary | ICD-10-CM

## 2024-03-14 DIAGNOSIS — R293 Abnormal posture: Secondary | ICD-10-CM | POA: Diagnosis not present

## 2024-03-14 DIAGNOSIS — M6281 Muscle weakness (generalized): Secondary | ICD-10-CM | POA: Diagnosis not present

## 2024-03-14 DIAGNOSIS — M5459 Other low back pain: Secondary | ICD-10-CM

## 2024-03-14 DIAGNOSIS — M79661 Pain in right lower leg: Secondary | ICD-10-CM | POA: Diagnosis not present

## 2024-03-22 ENCOUNTER — Encounter: Payer: Self-pay | Admitting: Physical Therapy

## 2024-03-22 ENCOUNTER — Ambulatory Visit: Admitting: Physical Therapy

## 2024-03-22 DIAGNOSIS — M6281 Muscle weakness (generalized): Secondary | ICD-10-CM | POA: Diagnosis not present

## 2024-03-22 DIAGNOSIS — M79661 Pain in right lower leg: Secondary | ICD-10-CM | POA: Diagnosis not present

## 2024-03-22 DIAGNOSIS — R293 Abnormal posture: Secondary | ICD-10-CM

## 2024-03-22 DIAGNOSIS — R29898 Other symptoms and signs involving the musculoskeletal system: Secondary | ICD-10-CM

## 2024-03-22 DIAGNOSIS — M5459 Other low back pain: Secondary | ICD-10-CM

## 2024-03-22 NOTE — Therapy (Signed)
 OUTPATIENT PHYSICAL THERAPY THORACOLUMBAR TREATMENT   Patient Name: Michele Andersen MRN: [id number] DOB:01/18/1976, 48 y.o., female Today's Date: 03/22/2024  END OF SESSION:  PT End of Session - 03/22/24 1156     Visit Number 3    Number of Visits 9    Date for PT Re-Evaluation 05/03/24    Authorization Type [employer]    Authorization Time Period 03/08/24 to 05/03/24    Authorization - Number of Visits 20    PT Start Time 1147    PT Stop Time 1226    PT Time Calculation (min) 39 min    Activity Tolerance Patient tolerated treatment well    Behavior During Therapy [facility] for tasks assessed/performed            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   DILITATION & CURRETTAGE/HYSTROSCOPY WITH HYDROTHERMAL ABLATION N/A 07/21/2022   Procedure: DILATATION & CURETTAGE/HYSTEROSCOPY WITH HYDROTHERMAL ABLATION;  Surgeon: Barbette Knock, MD;  Location: [facility];  Service: Gynecology;  Laterality: N/A;   ELBOW SURGERY Right 12/07/2015   @[facility]   by dr d. sebastian;    RIGHT EPICONDYLAR DEBRIDEMENT AND RADIAL  NEUROPLASTY   WRIST GANGLION EXCISION Right 12/13/2019   @[facility]    by   dr d. 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   Allergic rhinitis 08/12/2010   DEPRESSION 07/27/2007   EUSTACHIAN TUBE DYSFUNCTION 07/27/2007   SINUSITIS- ACUTE-NOS 07/27/2007   ECZEMA 07/27/2007    PCP: Charlett Howard MD   REFERRING PROVIDER: Joane Artist RAMAN, MD  REFERRING DIAG: Diagnosis M54.41,G89.29 (ICD-10-CM) - Chronic low back pain with right-sided sciatica, unspecified back pain laterality  Rationale for Evaluation and Treatment: Rehabilitation  THERAPY DIAG:  Other low back pain  Pain in right lower leg  Muscle weakness (generalized)  Abnormal posture  Other symptoms and signs involving the musculoskeletal system  ONSET DATE: chronic   SUBJECTIVE:  SUBJECTIVE STATEMENT:  Its been a painful week, I drove more than usual this weekend and now calf is really irritated. Calf is epicenter of pain today, back is better- would like to focus on calf today if we can   Eval: I've had problems for years in my lower back, usually I can work it out myself. See a chiropractor monthly for maintenance. Clemens down steps in January and broke my arm, wasn't able to stretch regularly. Having pain in my calf but it seems separate from sciatica pain. Chiropractor  told me calf is very tight too. I've done stretches in my own home, tried foam rollers, tennis balls, TENS, ice, heat, voltaren, chiropractic adjustments, traction.   PERTINENT HISTORY:  See above   PAIN:  Are you having pain? Yes: NPRS scale:  5/10 Pain location: R calf  Pain description: calf: squeezing/throbbing/constant  Aggravating factors: calf: walking, standing, sitting, sometimes no reason. Back:sitting for >30 minutes, cleaning  Relieving factors: calf:massage but it moves and I can't get in there, voltaren, rest; back, laying flat, decompressing back   PRECAUTIONS: None  RED FLAGS: None   WEIGHT BEARING RESTRICTIONS: No  FALLS:  Has patient fallen in last 6 months? 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 Morene Mace is taking over her care during this period)  OBJECTIVE:  Note: Objective measures were completed at Evaluation unless otherwise noted.  DIAGNOSTIC FINDINGS:   CLINICAL DATA:  Low back pain for years.   EXAM: LUMBAR SPINE - 2-3 VIEW   COMPARISON:  None Available.   FINDINGS: There is no evidence of lumbar spine fracture. Minimal curvature of spine. Minimal anterior spurring noted at L3 and L4. Intervertebral disc spaces are maintained.   IMPRESSION: Minimal degenerative joint changes of lumbar spine.  PATIENT SURVEYS:  PSFS: THE PATIENT SPECIFIC FUNCTIONAL SCALE  Place score of 0-10 (0 = unable to perform activity and 10 = able to perform activity at the same level as before injury or problem)  Activity Date: 03/08/24 Eval     Sitting over 30 minutes  5    2. Walking over 20 minutes  5    3.Turning head to the right when bending  4    4. Twisting  5    Total Score 4.75      Total Score = Sum of activity scores/number of activities  Minimally Detectable  Change: 3 points (for single activity); 2 points (for average score)  [facility] (nd). The Patient Specific Functional Scale . Retrieved from [email or web address]   COGNITION: Overall cognitive status: Within functional limits for tasks assessed     SENSATION: Some reports of very short zips of electricity from lateral calf down, rarely happens from hip/back down   MUSCLE LENGTH:  Quads OK, hip flexors OK, HS and piriformis OK B, SIJ in alignment per basic screen   POSTURE: rounded shoulders, forward head, decreased lumbar lordosis, and increased thoracic kyphosis  PALPATION: Deep mm spasms noted lateral calf R LE   LUMBAR ROM:   AROM eval  Flexion Full ROM  Extension Full ROM but stiff  feeling to her   Right lateral flexion WNL   Left lateral flexion WNL   Right rotation 50% limited   Left rotation 50% limited    (Blank rows = not tested)    LOWER EXTREMITY MMT:    MMT Right eval Left eval  Hip flexion 4+ 4+  Hip extension 3- 3  Hip abduction 4+ 3+  Hip adduction    Hip internal rotation    Hip external rotation    Knee flexion 4 4  Knee extension 4+ 4+  Ankle dorsiflexion    Ankle plantarflexion    Ankle inversion    Ankle eversion     (Blank rows = not tested)  LUMBAR SPECIAL TESTS:  SIJ in alignment, slump test (-) but does seem to have some mm tightness      TREATMENT DATE:  03/22/24  Scifit bike L4x8 minutes for w/u   Gastroc stretches on door frame 3x30 seconds B Soleus stretches on door frame 3x30 seconds B Hooklying HS stretches 2x30 seconds B   Percussion gun R calf and HS groups prone    03/13/24   There ex Nustep seat 6 L5x6 minutes BLEs only Supine Lower Trunk Rotation  - 2 x daily - 7 x weekly - 1 sets - 10 reps - 3-5 seconds  hold Incline board stretch 3x30 sec Neuro Re ed Supine Bridge with Resistance Band  green 3x10 4 sec hold Supine Posterior Pelvic Tilt   - 2 sets 10 reps - 3-5 seconds hold Supine Hip abduction T band Green 3x10 Supine heel lifts with TA activation  2x10 Supine hip add ball squeeze 10x10sec Supine hip abd Tband 3x10 green    03/08/24  Eval, POC, HEP practice and education as below  Nustep seat 6 L5x6 minutes BLEs only                                                                                                                                   PATIENT EDUCATION:  Education details: exam findings, POC, HEP  Person educated: Patient Education method: Programmer, multimedia, Demonstration, and Handouts Education comprehension: verbalized understanding, returned demonstration, and needs further education  HOME EXERCISE PROGRAM:  Access Code: [email or web address] URL: [email or web address] Date: 03/08/2024 Prepared by: Josette Rough  Exercises - Supine Bridge with Resistance Band  - 2 x daily - 7 x weekly - 1 sets - 10 reps - 2 seconds  hold - Supine Posterior Pelvic Tilt  - 2 x daily - 7 x weekly - 1 sets - 10 reps - 3-5 seconds  hold - Supine Lower Trunk Rotation  - 2 x daily - 7 x weekly - 1 sets - 10 reps - 3-5 seconds  hold  ASSESSMENT:  CLINICAL IMPRESSION:   Arrived today feeling OK, having a lot more calf pain now than back pain especially after driving longer distance to [place] over the weekend. Focused on addressing calf sx more today- felt  that this was possibility of being a second MSK issue at eval and this would make sense since it was aggravated by driving recently. Seemed to respond well to today's session, will continue to challenge as appropriate.    Eval:Patient is a 48 y.o. F who was seen today for physical therapy evaluation and treatment for Diagnosis M54.41,G89.29 (ICD-10-CM) - Chronic low back pain with right-sided sciatica, unspecified back pain laterality. Objectives as above, of note she does have a neuroma on the R that never received full course of skilled PT care for this- does have  some deep spasms in lateral R calf musculature  which might be related to changes in gait pattern from this issue. Seeing a chiropractor once a month, advised her that it would be beneficial to put this on hold for sake of making sure insurance will cover PT services, as well as therapist being able to clearly determine what interventions are working/not working.   OBJECTIVE IMPAIRMENTS: Abnormal gait, decreased mobility, difficulty walking, decreased strength, increased fascial restrictions, increased muscle spasms, impaired flexibility, postural dysfunction, and pain.   ACTIVITY LIMITATIONS: carrying, lifting, bending, sitting, standing, squatting, sleeping, stairs, transfers, and locomotion level  PARTICIPATION LIMITATIONS: meal prep, cleaning, laundry, shopping, community activity, occupation, and yard work  PERSONAL FACTORS: Education, Financial risk analyst, Past/current experiences, Profession, Social background, and Time since onset of injury/illness/exacerbation are also affecting patient's functional outcome.   REHAB POTENTIAL: Good  CLINICAL DECISION MAKING: Stable/uncomplicated  EVALUATION COMPLEXITY: Low   GOALS: Goals reviewed with patient? No  SHORT TERM GOALS: Target date: 04/05/2024      Will be compliant with appropriate progressive HEP GOAL STATUS: Initial   2. Will demonstrate improved postural awareness with all functional tasks, use of ergonomic aides PRN/as desired GOAL STATUS: Initial   3. Will demonstrate good functional biomechanics for bed mobility and floor to waist lifting mechanics  GOAL STATUS: Initial   4. Lumbar AROM to be full and without feelings of stiffness or increased pain GOAL STATUS: Initial     LONG TERM GOALS: Target date: 05/03/2024    MMT to have improved by one grade all weak groups GOAL STATUS: Initial  2. Mm flexibility and spasms to have improved by at least 50% in order to improve functional movement patterns and for pain control GOAL  STATUS: Initial  3. Pain to be no more than 1/10 with all functional activities including exercise as desired  GOAL STATUS: Initial   4. Will have improved score on PSFS  by at least 3 points to show improved QOL and subjective perception of condition    PLAN:  PT FREQUENCY: 1x/week  PT DURATION: 8 weeks  PLANNED INTERVENTIONS: 97750- Physical Performance Testing, 97110-Therapeutic exercises, 97530- Therapeutic activity, [id number]- Neuromuscular re-education, 97535- Self Care, 02859- Manual therapy, [id number]- Aquatic Therapy, and 20560 (1-2 muscles), 20561 (3+ muscles)- Dry Needling.  PLAN FOR NEXT SESSION: manual to lateral calf as appropriate (was asking about dry needling, aware of extra charge for this and seems agreeable), postural training, assess gait and need for orthotic as needed, how did she feel after last visit?   Josette Rough, PT, DPT 03/22/24 12:26 PM

## 2024-03-26 NOTE — Progress Notes (Unsigned)
    [name] [name] [address] [place] [address] Phone: [phone]   Assessment and Plan:     There are no diagnoses linked to this encounter.  ***   Pertinent previous records reviewed include ***    Follow Up: ***     Subjective:   I, Kamron Vanwyhe, am serving as a Neurosurgeon for Doctor Morene Mace  Chief Complaint: back pain   HPI:   02/14/2024 Michele Andersen is a 48 y.o. female who presents to [facility] today for low back and R lower leg pain ongoing since mid-March. Pt locates pain to right side lower back radiating into the R LE. Experiencing more pain in the calf. Denies increased warmth, swelling, erythema. Sx have responded well with Chiro treatments and massage. Broke humerus in Jan, feels that sx were exacerbated just before coming out of the cast, was unable to do self body work.    Radiating pain: R LE LE numbness/tingling: denies LE weakness: intermittent Aggravates: sx are constant, ambulation Treatments tried: topical NSAIDs, Chiropractic adjustments  03/27/2024 Patient states   Relevant Historical Information: ***  Additional pertinent review of systems negative.   Current Outpatient Medications:    cetirizine (ZYRTEC) 10 MG tablet, Take 10 mg by mouth daily., Disp: , Rfl:    escitalopram  (LEXAPRO ) 10 MG tablet, TAKE 1 TABLET(10 MG) BY MOUTH DAILY, Disp: 90 tablet, Rfl: 2   hydrOXYzine  (VISTARIL ) 25 MG capsule, TAKE 1 CAPSULE(25 MG) BY MOUTH EVERY 8 HOURS AS NEEDED FOR ANXIETY, Disp: 30 capsule, Rfl: 1   Multiple Vitamins-Minerals (MULTIVITAMIN WITH MINERALS) tablet, Take 1 tablet by mouth daily., Disp: , Rfl:    triamcinolone  cream (KENALOG ) 0.1 %, Apply 1 Application topically 2 (two) times daily. For eczema, Disp: 30 g, Rfl: 1   Objective:     There were no vitals filed for this visit.    There is no height or weight on file to calculate BMI.    Physical  Exam:    ***   Electronically signed by:  Odis Mace [name] 12:00 PM 03/26/24

## 2024-03-27 ENCOUNTER — Ambulatory Visit: Admitting: Sports Medicine

## 2024-03-27 VITALS — HR 87 | Ht 63.0 in | Wt 149.0 lb

## 2024-03-27 DIAGNOSIS — M79661 Pain in right lower leg: Secondary | ICD-10-CM | POA: Diagnosis not present

## 2024-03-27 DIAGNOSIS — G8929 Other chronic pain: Secondary | ICD-10-CM | POA: Diagnosis not present

## 2024-03-27 DIAGNOSIS — M5441 Lumbago with sciatica, right side: Secondary | ICD-10-CM

## 2024-03-27 MED ORDER — MELOXICAM 15 MG PO TABS: ORAL_TABLET | ORAL | 0 refills | Status: DC

## 2024-03-27 NOTE — Patient Instructions (Signed)
-   Start meloxicam  15 mg daily x2 weeks.  If still having pain after 2 weeks, complete 3rd-week of NSAID. May use remaining NSAID as needed once daily for pain control.  Do not to use additional over-the-counter NSAIDs (ibuprofen, naproxen, Advil, Aleve, etc.) while taking prescription NSAIDs.  May use Tylenol [phone] mg 2 to 3 times a day for breakthrough pain. Calf HEP  4 week follow up

## 2024-03-29 ENCOUNTER — Ambulatory Visit: Admitting: Physical Therapy

## 2024-03-29 ENCOUNTER — Encounter: Payer: Self-pay | Admitting: Physical Therapy

## 2024-03-29 DIAGNOSIS — M79661 Pain in right lower leg: Secondary | ICD-10-CM | POA: Diagnosis not present

## 2024-03-29 DIAGNOSIS — R293 Abnormal posture: Secondary | ICD-10-CM

## 2024-03-29 DIAGNOSIS — M5459 Other low back pain: Secondary | ICD-10-CM

## 2024-03-29 DIAGNOSIS — M6281 Muscle weakness (generalized): Secondary | ICD-10-CM

## 2024-03-29 DIAGNOSIS — R29898 Other symptoms and signs involving the musculoskeletal system: Secondary | ICD-10-CM

## 2024-03-29 NOTE — Therapy (Signed)
 OUTPATIENT PHYSICAL THERAPY THORACOLUMBAR TREATMENT   Patient Name: Michele Andersen MRN: [id number] DOB:09/03/1975, 48 y.o., female Today's Date: 03/29/2024  END OF SESSION:  PT End of Session - 03/29/24 1309     Visit Number 4    Number of Visits 9    Date for PT Re-Evaluation 05/03/24    Authorization Type [employer]    Authorization Time Period 03/08/24 to 05/03/24    Authorization - Number of Visits 20    PT Start Time 1302    PT Stop Time 1340    PT Time Calculation (min) 38 min    Activity Tolerance Patient tolerated treatment well    Behavior During Therapy [facility] for tasks assessed/performed             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   DILITATION & CURRETTAGE/HYSTROSCOPY WITH HYDROTHERMAL ABLATION N/A 07/21/2022   Procedure: DILATATION & CURETTAGE/HYSTEROSCOPY WITH HYDROTHERMAL ABLATION;  Surgeon: Barbette Knock, MD;  Location: [facility];  Service: Gynecology;  Laterality: N/A;   ELBOW SURGERY Right 12/07/2015   @[facility]   by dr d. sebastian;    RIGHT EPICONDYLAR DEBRIDEMENT AND RADIAL  NEUROPLASTY   WRIST GANGLION EXCISION Right 12/13/2019   @[facility]    by   dr d. 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   Allergic rhinitis 08/12/2010   DEPRESSION 07/27/2007   EUSTACHIAN TUBE DYSFUNCTION 07/27/2007   SINUSITIS- ACUTE-NOS 07/27/2007   ECZEMA 07/27/2007    PCP: Charlett Howard MD   REFERRING PROVIDER: Joane Artist RAMAN, MD  REFERRING DIAG: Diagnosis M54.41,G89.29 (ICD-10-CM) - Chronic low back pain with right-sided sciatica, unspecified back pain laterality  Rationale for Evaluation and Treatment: Rehabilitation  THERAPY DIAG:  Other low back pain  Pain in right lower leg  Muscle weakness (generalized)  Abnormal posture  Other symptoms and signs involving the musculoskeletal system  ONSET DATE: chronic   SUBJECTIVE:  SUBJECTIVE STATEMENT:  Feeling OK today, had two pops in my calf through the week and had a lot of relief after maybe they were adhesions breaking up? Back is getting stronger but still hurts. Didn't really feel the calf until I was driving earlier today   Eval: I've had problems for years in my lower back, usually I can work it out myself. See a chiropractor monthly for maintenance. Clemens down steps in January and broke my arm, wasn't able to stretch regularly. Having pain in my calf but it seems separate from sciatica pain. Chiropractor  told me calf is very tight too. I've done stretches in my own home, tried foam rollers, tennis balls, TENS, ice, heat, voltaren, chiropractic adjustments, traction.   PERTINENT HISTORY:  See above   PAIN:   Are you having pain? Yes: NPRS scale: 3/10 Pain location: low back  Pain description: tight, squeezing, small amounts of nerve pain earlier but not now  Aggravating factors:  sitting for >30 minutes, cleaning  Relieving factors:  laying flat, decompressing back   PRECAUTIONS: None  RED FLAGS: None   WEIGHT BEARING RESTRICTIONS: No  FALLS:  Has patient fallen in last 6 months? 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 Morene Mace is taking over her care during this period)  OBJECTIVE:  Note: Objective measures were completed at Evaluation unless otherwise noted.  DIAGNOSTIC FINDINGS:   CLINICAL DATA:  Low back pain for years.   EXAM: LUMBAR SPINE - 2-3 VIEW   COMPARISON:  None Available.   FINDINGS: There is no evidence of lumbar spine fracture. Minimal curvature of spine. Minimal anterior spurring noted at L3 and L4. Intervertebral disc spaces are maintained.   IMPRESSION: Minimal degenerative joint changes of lumbar spine.  PATIENT SURVEYS:  PSFS: THE PATIENT SPECIFIC FUNCTIONAL SCALE  Place score of 0-10 (0 = unable to perform activity and 10 = able to perform activity at the same level as before injury or problem)  Activity Date: 03/08/24 Eval     Sitting over 30 minutes  5    2. Walking over 20 minutes  5    3.Turning head to the right when bending  4    4. Twisting  5    Total Score 4.75      Total Score = Sum of activity scores/number of activities  Minimally Detectable Change: 3 points (for single activity); 2 points (for average  score)  [facility] (nd). The Patient Specific Functional Scale . Retrieved from [email or web address]   COGNITION: Overall cognitive status: Within functional limits for tasks assessed     SENSATION: Some reports of very short zips of electricity from lateral calf down, rarely happens from hip/back down   MUSCLE LENGTH:  Quads OK, hip flexors OK, HS and piriformis OK B, SIJ in alignment per basic screen   POSTURE: rounded shoulders, forward head, decreased lumbar lordosis, and increased thoracic kyphosis  PALPATION: Deep mm spasms noted lateral calf R LE   LUMBAR ROM:   AROM eval 03/29/24  Flexion Full ROM   Extension Full ROM but stiff feeling  to her    Right lateral flexion WNL  WNL   Left lateral flexion WNL  WNL   Right rotation 50% limited  25% limited   Left rotation 50% limited  25% limited    (Blank rows = not tested)    LOWER EXTREMITY MMT:    MMT Right eval Left eval Right 03/29/24 Left 03/29/24  Hip flexion 4+ 4+ 5 5  Hip extension 3- 3 4- 4-  Hip abduction 4+ 3+ 4+ 4-  Hip adduction      Hip internal rotation      Hip external rotation      Knee flexion 4 4 5 5   Knee extension 4+ 4+ 5 5  Ankle dorsiflexion      Ankle plantarflexion      Ankle inversion      Ankle eversion       (Blank rows = not tested)  LUMBAR SPECIAL TESTS:  SIJ in alignment, slump test (-) but does seem to have some mm tightness      TREATMENT DATE:   03/29/24  Nustep L5x8 minutes BLEs only seat 5 Lumbar ROM and MMT check/goal updates  PPT 12x3 seconds  PPT + march x12 PPT + SLR x10 B Supine TA sets 10x3 seconds Gastroc stretch on doorframe 2x30 seconds R  Soleus stretch on doorframe 2x30 seconds R  R HS stretch standing 2x30 seconds R   Percussion gun R calf and HS groups prone      03/22/24  Scifit bike L4x8 minutes for w/u   Gastroc stretches on door frame 3x30 seconds B Soleus stretches on  door frame 3x30 seconds B Hooklying HS stretches 2x30 seconds B   Percussion gun R calf and HS groups prone    03/13/24   There ex Nustep seat 6 L5x6 minutes BLEs only Supine Lower Trunk Rotation  - 2 x daily - 7 x weekly - 1 sets - 10 reps - 3-5 seconds  hold Incline board stretch 3x30 sec Neuro Re ed Supine Bridge with Resistance Band  green 3x10 4 sec hold Supine Posterior Pelvic Tilt  - 2 sets 10 reps - 3-5 seconds hold Supine Hip abduction T band Green 3x10 Supine heel lifts with TA activation  2x10 Supine hip add ball squeeze 10x10sec Supine hip abd Tband 3x10 green    03/08/24  Eval, POC, HEP practice and education as below  Nustep seat 6 L5x6 minutes BLEs only                                                                                                                                   PATIENT EDUCATION:  Education details: exam findings, POC, HEP  Person educated: Patient Education method: Programmer, multimedia, Demonstration, and Handouts Education comprehension: verbalized understanding, returned demonstration, and needs further education  HOME EXERCISE PROGRAM:  Access Code: [email or web address] URL: [email or web address] Date: 03/29/2024 Prepared by: Josette Rough  Exercises - Supine Bridge with  Resistance Band  - 2 x daily - 7 x weekly - 1 sets - 10 reps - 2 seconds  hold - Supine Posterior Pelvic Tilt  - 2 x daily - 7 x weekly - 1 sets - 10 reps - 3-5 seconds  hold - Supine Lower Trunk Rotation  - 2 x daily - 7 x weekly - 1 sets - 10 reps - 3-5 seconds  hold - Supine March with Posterior Pelvic Tilt  - 1 x daily - 7 x weekly - 2 sets - 10 reps - Small Range SLR + Posterior Pelvic Tilt   - 1 x daily - 7 x weekly - 2 sets - 10 reps - Standing Transverse Abdominis Contraction  - 3 x daily - 7 x weekly - 10 reps - 3-5 seconds  hold  ASSESSMENT:  CLINICAL IMPRESSION:  Arrives feeling better today, sounds like percussion gun was helpful in addressing calf sx  and there has been some progress in this area. Updated some objectives, otherwise progressed core strength and continued treated calf with percussion gun. Will continue to challenge her.    Eval:Patient is a 48 y.o. F who was seen today for physical therapy evaluation and treatment for Diagnosis M54.41,G89.29 (ICD-10-CM) - Chronic low back pain with right-sided sciatica, unspecified back pain laterality. Objectives as above, of note she does have a neuroma on the R that never received full course of skilled PT care for this- does have some deep spasms in lateral R calf musculature  which might be related to changes in gait pattern from this issue. Seeing a chiropractor once a month, advised her that it would be beneficial to put this on hold for sake of making sure insurance will cover PT services, as well as therapist being able to clearly determine what interventions are working/not working.   OBJECTIVE IMPAIRMENTS: Abnormal gait, decreased mobility, difficulty walking, decreased strength, increased fascial restrictions, increased muscle spasms, impaired flexibility, postural dysfunction, and pain.   ACTIVITY LIMITATIONS: carrying, lifting, bending, sitting, standing, squatting, sleeping, stairs, transfers, and locomotion level  PARTICIPATION LIMITATIONS: meal prep, cleaning, laundry, shopping, community activity, occupation, and yard work  PERSONAL FACTORS: Education, Financial risk analyst, Past/current experiences, Profession, Social background, and Time since onset of injury/illness/exacerbation are also affecting patient's functional outcome.   REHAB POTENTIAL: Good  CLINICAL DECISION MAKING: Stable/uncomplicated  EVALUATION COMPLEXITY: Low   GOALS: Goals reviewed with patient? No  SHORT TERM GOALS: Target date: 04/05/2024      Will be compliant with appropriate progressive HEP GOAL STATUS: met 03/29/24   2. Will demonstrate improved postural awareness with all functional tasks, use of ergonomic  aides PRN/as desired GOAL STATUS: ongoing 03/29/24   3. Will demonstrate good functional biomechanics for bed mobility and floor to waist lifting mechanics  GOAL STATUS: ongoing 03/29/24  4. Lumbar AROM to be full and without feelings of stiffness or increased pain GOAL STATUS: ongoing 03/29/24    LONG TERM GOALS: Target date: 05/03/2024    MMT to have improved by one grade all weak groups GOAL STATUS: Initial  2. Mm flexibility and spasms to have improved by at least 50% in order to improve functional movement patterns and for pain control GOAL STATUS: Initial  3. Pain to be no more than 1/10 with all functional activities including exercise as desired  GOAL STATUS: Initial   4. Will have improved score on PSFS  by at least 3 points to show improved QOL and subjective perception of condition    PLAN:  PT FREQUENCY: 1x/week  PT DURATION: 8 weeks  PLANNED INTERVENTIONS: 97750- Physical Performance Testing, 97110-Therapeutic exercises, 97530- Therapeutic activity, [id number]- Neuromuscular re-education, 97535- Self Care, [id number]- Manual therapy, [id number]- Aquatic Therapy, and 20560 (1-2 muscles), 20561 (3+ muscles)- Dry Needling.  PLAN FOR NEXT SESSION: manual to lateral calf as appropriate (was asking about dry needling, aware of extra charge for this and seems agreeable), postural training, assess gait and need for orthotic as needed, percussion gun as desired   Josette Rough, PT, DPT 03/29/24 1:41 PM

## 2024-04-03 ENCOUNTER — Encounter: Payer: Self-pay | Admitting: Internal Medicine

## 2024-04-04 NOTE — Therapy (Signed)
 OUTPATIENT PHYSICAL THERAPY THORACOLUMBAR TREATMENT   Patient Name: Michele Andersen MRN: [id number] DOB:[birth year], 48 y.o., female Today's Date: 04/05/2024  END OF SESSION:  PT End of Session - 04/05/24 1259     Visit Number 5    Number of Visits 9    Date for PT Re-Evaluation 05/03/24    Authorization Type [employer]    Authorization Time Period 03/08/24 to 05/03/24    Authorization - Number of Visits 20    PT Start Time 1301    PT Stop Time 1349    PT Time Calculation (min) 48 min    Activity Tolerance Patient tolerated treatment well    Behavior During Therapy [facility] for tasks assessed/performed              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   DILITATION & CURRETTAGE/HYSTROSCOPY WITH HYDROTHERMAL ABLATION N/A 07/21/2022   Procedure: DILATATION & CURETTAGE/HYSTEROSCOPY WITH HYDROTHERMAL ABLATION;  Surgeon: Barbette Knock, MD;  Location: [facility];  Service: Gynecology;  Laterality: N/A;   ELBOW SURGERY Right 12/07/2015   @[facility]   by dr d. sebastian;    RIGHT EPICONDYLAR DEBRIDEMENT AND RADIAL  NEUROPLASTY   WRIST GANGLION EXCISION Right 12/13/2019   @[facility]    by   dr d. 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   Allergic rhinitis 08/12/2010   DEPRESSION 07/27/2007   EUSTACHIAN TUBE DYSFUNCTION 07/27/2007   SINUSITIS- ACUTE-NOS 07/27/2007   ECZEMA 07/27/2007    PCP: Charlett Howard MD   REFERRING PROVIDER: Joane Artist RAMAN, MD  REFERRING DIAG: Diagnosis M54.41,G89.29 (ICD-10-CM) - Chronic low back pain with right-sided sciatica, unspecified back pain laterality  Rationale for Evaluation and Treatment: Rehabilitation  THERAPY DIAG:  Other low back pain  Pain in right lower leg  Abnormal posture  Other symptoms and signs involving the musculoskeletal system  Muscle weakness (generalized)  ONSET DATE: chronic   SUBJECTIVE:  SUBJECTIVE STATEMENT: Patient noting an event in the hip that has improved her sciatica symptoms.    PERTINENT HISTORY:  Eval: I've had problems for years in my lower back, usually I can work it out myself. See a chiropractor monthly for maintenance. Clemens down steps in January and broke my arm, wasn't able to stretch regularly. Having pain in my calf but it seems separate from sciatica pain. Chiropractor  told me calf is very tight too. I've done stretches in my own home, tried foam rollers, tennis balls, TENS, ice, heat, voltaren, chiropractic adjustments, traction.   PAIN:  Are you having pain? Yes: NPRS scale: 3/10 Pain location: low back  Pain description: tight, squeezing, small amounts of nerve pain earlier but not now  Aggravating  factors:  sitting for >30 minutes, cleaning  Relieving factors:  laying flat, decompressing back   PRECAUTIONS: None  RED FLAGS: None   WEIGHT BEARING RESTRICTIONS: No  FALLS:  Has patient fallen in last 6 months? 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 Morene Mace is taking over her care during this period)  OBJECTIVE:  Note: Objective measures were completed at Evaluation unless otherwise noted.  DIAGNOSTIC FINDINGS:   CLINICAL DATA:  Low back pain for years.   EXAM: LUMBAR SPINE - 2-3 VIEW   COMPARISON:  None Available.   FINDINGS: There is no evidence of lumbar spine fracture. Minimal curvature of spine. Minimal anterior spurring noted at L3 and L4. Intervertebral disc spaces are maintained.   IMPRESSION: Minimal degenerative joint changes of lumbar spine.  PATIENT SURVEYS:  PSFS: THE PATIENT SPECIFIC FUNCTIONAL SCALE  Place score of 0-10 (0 = unable to perform activity and 10 = able to perform activity at the same level as before injury or problem)  Activity Date: 03/08/24 Eval     Sitting over 30 minutes  5    2. Walking over 20 minutes  5    3.Turning head to the right when bending  4    4. Twisting  5    Total Score 4.75      Total Score = Sum of activity scores/number of activities  Minimally Detectable Change: 3 points (for single activity); 2 points (for average score)  [facility] (nd). The Patient Specific Functional Scale . Retrieved from [email or web address]    COGNITION: Overall cognitive status: Within functional limits for tasks assessed     SENSATION: Some reports of very short zips of electricity from lateral calf down, rarely happens from hip/back down   MUSCLE LENGTH:  Quads OK, hip flexors OK, HS and piriformis OK B, SIJ in alignment per basic screen   POSTURE: rounded shoulders, forward head, decreased lumbar lordosis, and increased thoracic kyphosis  PALPATION: Deep mm spasms noted lateral calf R LE   LUMBAR ROM:   AROM eval 03/29/24  Flexion Full ROM   Extension Full ROM but stiff feeling to her    Right lateral flexion WNL  WNL   Left lateral flexion WNL  WNL   Right rotation 50% limited  25% limited   Left rotation 50% limited  25% limited    (Blank rows = not tested)    LOWER EXTREMITY MMT:    MMT Right eval Left eval Right 03/29/24 Left 03/29/24  Hip flexion 4+ 4+ 5 5  Hip extension 3- 3 4- 4-  Hip abduction 4+ 3+ 4+ 4-  Hip adduction      Hip internal rotation      Hip external rotation      Knee flexion 4 4 5 5   Knee extension 4+ 4+ 5 5  Ankle dorsiflexion      Ankle plantarflexion      Ankle inversion      Ankle eversion       (Blank rows = not tested)  LUMBAR SPECIAL TESTS:  SIJ in alignment, slump test (-) but does seem to have some mm tightness      TREATMENT DATE: 04/08/2024 TherEx:  Nustep seat 5, level 5 for 8 minutes with bilat LE only Slant board gastroc stretch 3x30s Lateral walks with red TB around knees 3x10 each direction    Neuro Re-Ed:  Assessment of tandem stance on firm surface with intermittent use of UE to self correct increased sway and minor lateral LOB  Assessment of narrow stance balance on compliant surface; increased sway but no LOB or use of UEs Tandem stance 3x20s each leg in back with decreased use for UEs with time  Single leg stance on foam performing square taps x8 each leg  Discussed 3 balance systems as well as balance strategies and how it all interacts   Discussed and performed sciatic nerve glide to add to HEP for when patient is experiencing radiating pain   Manual IASTM with percussion device to right gastroc and hamstring    03/29/24  Nustep L5x8 minutes BLEs only seat 5 Lumbar ROM and MMT check/goal updates  PPT 12x3 seconds  PPT + march x12 PPT + SLR x10 B Supine TA sets 10x3 seconds Gastroc stretch on doorframe 2x30 seconds R  Soleus stretch on doorframe 2x30 seconds R  R HS stretch standing 2x30 seconds R   Percussion gun R calf and HS groups prone    03/22/24  Scifit bike L4x8 minutes for w/u   Gastroc stretches on door frame 3x30 seconds B Soleus stretches on door frame 3x30 seconds B Hooklying HS stretches 2x30 seconds B   Percussion gun R calf and HS groups prone    03/13/24   There ex Nustep seat 6 L5x6 minutes BLEs only Supine Lower Trunk Rotation  - 2 x daily - 7 x weekly - 1 sets - 10 reps - 3-5 seconds  hold Incline board stretch 3x30 sec Neuro Re ed Supine Bridge with Resistance Band  green 3x10 4 sec hold Supine Posterior Pelvic Tilt  - 2 sets 10 reps - 3-5 seconds hold Supine Hip abduction T band Green 3x10 Supine heel lifts with TA activation  2x10 Supine hip add ball squeeze 10x10sec Supine hip abd Tband 3x10 green    PATIENT EDUCATION:  Education details: exam findings, POC, HEP  Person educated: Patient Education method: Programmer, multimedia, Demonstration, and Handouts Education comprehension: verbalized understanding, returned demonstration, and needs further education  HOME EXERCISE PROGRAM:  Access Code: [email or web address] URL: [email or web address] Date: 03/29/2024 Prepared by: Josette Rough  Exercises - Supine Bridge with Resistance Band  - 2 x daily - 7 x weekly - 1 sets - 10 reps - 2 seconds  hold - Supine Posterior Pelvic Tilt  - 2 x daily - 7 x weekly - 1  sets - 10 reps - 3-5 seconds  hold - Supine Lower Trunk Rotation  - 2 x daily - 7 x weekly - 1 sets - 10 reps - 3-5  seconds  hold - Supine March with Posterior Pelvic Tilt  - 1 x daily - 7 x weekly - 2 sets - 10 reps - Small Range SLR + Posterior Pelvic Tilt   - 1 x daily - 7 x weekly - 2 sets - 10 reps - Standing Transverse Abdominis Contraction  - 3 x daily - 7 x weekly - 10 reps - 3-5 seconds  hold - Seated Slump Nerve Glide  - 1 x daily - 7 x weekly - 3 sets - 10 reps (added by Susannah Daring 04/05/2024)  ASSESSMENT:  CLINICAL IMPRESSION: Patient arrived to session noting improvement in sciatica symptoms secondary to shifting in her hips. Patient tolerated all activities this date with some noted sensitivity in distal hamstring and lateral gastroc with percussion device. PT added seated sciatic nerve glide to HEP to assist with soreness/discomfort from sciatica pain when at home. Patient will continue to benefit from skilled PT.  OBJECTIVE IMPAIRMENTS: Abnormal gait, decreased mobility, difficulty walking, decreased strength, increased fascial restrictions, increased muscle spasms, impaired flexibility, postural dysfunction, and pain.   ACTIVITY LIMITATIONS: carrying, lifting, bending, sitting, standing, squatting, sleeping, stairs, transfers, and locomotion level  PARTICIPATION LIMITATIONS: meal prep, cleaning, laundry, shopping, community activity, occupation, and yard work  PERSONAL FACTORS: Education, Financial risk analyst, Past/current experiences, Profession, Social background, and Time since onset of injury/illness/exacerbation are also affecting patient's functional outcome.   REHAB POTENTIAL: Good  CLINICAL DECISION MAKING: Stable/uncomplicated  EVALUATION COMPLEXITY: Low   GOALS: Goals reviewed with patient? No  SHORT TERM GOALS: Target date: 04/05/2024      Will be compliant with appropriate progressive HEP GOAL STATUS: met 03/29/24   2. Will demonstrate improved postural awareness with all functional tasks, use of ergonomic aides PRN/as desired GOAL STATUS: ongoing 03/29/24   3. Will  demonstrate good functional biomechanics for bed mobility and floor to waist lifting mechanics  GOAL STATUS: ongoing 03/29/24  4. Lumbar AROM to be full and without feelings of stiffness or increased pain GOAL STATUS: ongoing 03/29/24    LONG TERM GOALS: Target date: 05/03/2024    MMT to have improved by one grade all weak groups GOAL STATUS: Initial  2. Mm flexibility and spasms to have improved by at least 50% in order to improve functional movement patterns and for pain control GOAL STATUS: Initial  3. Pain to be no more than 1/10 with all functional activities including exercise as desired  GOAL STATUS: Initial   4. Will have improved score on PSFS  by at least 3 points to show improved QOL and subjective perception of condition    PLAN:  PT FREQUENCY: 1x/week  PT DURATION: 8 weeks  PLANNED INTERVENTIONS: 97750- Physical Performance Testing, 97110-Therapeutic exercises, 97530- Therapeutic activity, [id number]- Neuromuscular re-education, 97535- Self Care, [id number]- Manual therapy, [id number]- Aquatic Therapy, and 20560 (1-2 muscles), 20561 (3+ muscles)- Dry Needling.  PLAN FOR NEXT SESSION: assess sciatic nerve glide added to HEP, manual to lateral calf as appropriate (was asking about dry needling, aware of extra charge for this and seems agreeable), postural training, assess gait and need for orthotic as needed, percussion gun as desired   Susannah Daring, PT, DPT 04/05/24 2:50 PM

## 2024-04-05 ENCOUNTER — Ambulatory Visit

## 2024-04-05 DIAGNOSIS — R29898 Other symptoms and signs involving the musculoskeletal system: Secondary | ICD-10-CM

## 2024-04-05 DIAGNOSIS — M79661 Pain in right lower leg: Secondary | ICD-10-CM

## 2024-04-05 DIAGNOSIS — M6281 Muscle weakness (generalized): Secondary | ICD-10-CM

## 2024-04-05 DIAGNOSIS — R293 Abnormal posture: Secondary | ICD-10-CM | POA: Diagnosis not present

## 2024-04-05 DIAGNOSIS — M5459 Other low back pain: Secondary | ICD-10-CM | POA: Diagnosis not present

## 2024-04-11 NOTE — Therapy (Signed)
 OUTPATIENT PHYSICAL THERAPY THORACOLUMBAR TREATMENT   Patient Name: Michele Andersen MRN: [id number] DOB:1976-06-10, 48 y.o., female Today's Date: 04/12/2024  END OF SESSION:  PT End of Session - 04/12/24 1302     Visit Number 6    Number of Visits 9    Date for PT Re-Evaluation 05/03/24    Authorization Type [employer]    Authorization Time Period 03/08/24 to 05/03/24    Authorization - Number of Visits 20    PT Start Time 1302    PT Stop Time 1346    PT Time Calculation (min) 44 min    Activity Tolerance Patient tolerated treatment well    Behavior During Therapy [facility] for tasks assessed/performed            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   DILITATION & CURRETTAGE/HYSTROSCOPY WITH HYDROTHERMAL ABLATION N/A 07/21/2022   Procedure: DILATATION & CURETTAGE/HYSTEROSCOPY WITH HYDROTHERMAL ABLATION;  Surgeon: Barbette Knock, MD;  Location: [facility];  Service: Gynecology;  Laterality: N/A;   ELBOW SURGERY Right 12/07/2015   @[facility]   by dr d. sebastian;    RIGHT EPICONDYLAR DEBRIDEMENT AND RADIAL  NEUROPLASTY   WRIST GANGLION EXCISION Right 12/13/2019   @[facility]    by   dr d. 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   Allergic rhinitis 08/12/2010   DEPRESSION 07/27/2007   EUSTACHIAN TUBE DYSFUNCTION 07/27/2007   SINUSITIS- ACUTE-NOS 07/27/2007   ECZEMA 07/27/2007    PCP: Charlett Howard MD   REFERRING PROVIDER: Joane Artist RAMAN, MD  REFERRING DIAG: Diagnosis M54.41,G89.29 (ICD-10-CM) - Chronic low back pain with right-sided sciatica, unspecified back pain laterality  Rationale for Evaluation and Treatment: Rehabilitation  THERAPY DIAG:  Other low back pain  Pain in right lower leg  Abnormal posture  Other symptoms and signs involving the musculoskeletal system  Muscle weakness (generalized)  ONSET DATE: chronic   SUBJECTIVE:  SUBJECTIVE STATEMENT: It's been going better this week. Pain rated at 3/10 this session.    PERTINENT HISTORY:  Eval: I've had problems for years in my lower back, usually I can work it out myself. See a chiropractor monthly for maintenance. Clemens down steps in January and broke my arm, wasn't able to stretch regularly. Having pain in my calf but it seems separate from sciatica pain. Chiropractor  told me calf is very tight too. I've done stretches in my own home, tried foam rollers, tennis balls, TENS, ice, heat, voltaren, chiropractic adjustments, traction.   PAIN:  Are you having pain? Yes: NPRS scale: 3/10 Pain location: low back  Pain description: tight, squeezing, small amounts of nerve pain earlier but not now  Aggravating factors:   sitting for >30 minutes, cleaning  Relieving factors:  laying flat, decompressing back   PRECAUTIONS: None  RED FLAGS: None   WEIGHT BEARING RESTRICTIONS: No  FALLS:  Has patient fallen in last 6 months? 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 Morene Mace is taking over her care during this period)  OBJECTIVE:  Note: Objective measures were completed at Evaluation unless otherwise noted.  DIAGNOSTIC FINDINGS:   CLINICAL DATA:  Low back pain for years.   EXAM: LUMBAR SPINE - 2-3 VIEW   COMPARISON:  None Available.   FINDINGS: There is no evidence of lumbar spine fracture. Minimal curvature of spine. Minimal anterior spurring noted at L3 and L4. Intervertebral disc spaces are maintained.   IMPRESSION: Minimal degenerative joint changes of lumbar spine.  PATIENT SURVEYS:  PSFS: THE PATIENT SPECIFIC FUNCTIONAL SCALE  Place score of 0-10 (0 = unable to perform activity and 10 = able to perform activity at the same level as before injury or problem)  Activity Date: 03/08/24 Eval     Sitting over 30 minutes  5    2. Walking over 20 minutes  5    3.Turning head to the right when bending  4    4. Twisting  5    Total Score 4.75      Total Score = Sum of activity scores/number of activities  Minimally Detectable Change: 3 points (for single activity); 2 points (for average score)  [facility] (nd). The Patient Specific Functional Scale . Retrieved from [email or web address]    COGNITION: Overall cognitive status: Within functional limits for tasks assessed     SENSATION: Some reports of very short zips of electricity from lateral calf down, rarely happens from hip/back down   MUSCLE LENGTH:  Quads OK, hip flexors OK, HS and piriformis OK B, SIJ in alignment per basic screen   POSTURE: rounded shoulders, forward head, decreased lumbar lordosis, and increased thoracic kyphosis  PALPATION: Deep mm spasms noted lateral calf R LE   LUMBAR ROM:   AROM eval 03/29/24  Flexion Full ROM   Extension Full ROM but stiff feeling to her    Right lateral flexion WNL  WNL   Left lateral flexion WNL  WNL   Right rotation 50% limited  25% limited   Left rotation 50% limited  25% limited    (Blank rows = not tested)    LOWER EXTREMITY MMT:    MMT Right eval Left eval Right 03/29/24 Left 03/29/24  Hip flexion 4+ 4+ 5 5  Hip extension 3- 3 4- 4-  Hip abduction 4+ 3+ 4+ 4-  Hip adduction      Hip internal rotation      Hip external rotation      Knee flexion 4 4 5 5   Knee extension 4+ 4+ 5 5  Ankle dorsiflexion      Ankle plantarflexion      Ankle inversion      Ankle eversion       (Blank rows = not tested)  LUMBAR SPECIAL TESTS:  SIJ in alignment, slump test (-) but does seem to have some mm tightness      TREATMENT DATE: 04/12/2024 TherEx:  Recumbent bike level 4 for 6 minutes  Discussed HEP and provided green and blue TB to increase intensity of supine bridges  Squats with red TB around knees for abduction cue 2x10   Neuro Re-Ed Single leg stance on foam performing square taps x5 each leg  Fwd/backward on square balance board with intermittent UE use in parallel bars 2x10; focus on control movement and balance  Side to side on square balance board 1x10 ; focus on control of movement and balance  Tandem stance to challenge ankle strategy 2x25s   Manual IASTM with percussion device to right gastroc and hamstring    04/08/2024 TherEx:   Nustep seat 5, level 5 for 8 minutes with bilat LE only Slant board gastroc stretch 3x30s Lateral walks with red TB around knees 3x10 each direction    Neuro Re-Ed:  Assessment of tandem stance on firm surface with intermittent use of UE to self correct increased sway and minor lateral LOB  Assessment of narrow stance balance on compliant surface; increased sway but no LOB or use of UEs Tandem stance 3x20s each leg in back with decreased use for UEs with time  Single leg stance on foam performing square taps x8 each leg  Discussed 3 balance systems as well as balance strategies and how it all interacts  Discussed and performed sciatic nerve glide to add to HEP for when patient is experiencing radiating pain   Manual IASTM with percussion device to right gastroc and hamstring    03/29/24  Nustep L5x8 minutes BLEs only seat 5 Lumbar ROM and MMT check/goal updates  PPT 12x3 seconds  PPT + march x12 PPT + SLR x10 B Supine TA sets 10x3 seconds Gastroc stretch on doorframe 2x30 seconds R  Soleus stretch on doorframe 2x30 seconds R  R HS stretch standing 2x30 seconds R   Percussion gun R calf and HS groups prone    03/22/24  Scifit bike L4x8 minutes for w/u   Gastroc stretches on door frame 3x30 seconds B Soleus stretches on door frame 3x30 seconds B Hooklying HS stretches 2x30 seconds B   Percussion gun R calf and HS groups prone    03/13/24   There ex Nustep seat 6 L5x6 minutes BLEs only Supine Lower Trunk Rotation  - 2 x daily - 7 x weekly - 1 sets - 10 reps - 3-5 seconds  hold Incline board stretch 3x30 sec Neuro Re ed Supine Bridge with Resistance Band  green 3x10 4 sec hold Supine Posterior Pelvic Tilt  - 2 sets 10 reps - 3-5 seconds hold Supine Hip abduction T band Green 3x10 Supine  heel lifts with TA activation  2x10 Supine hip add ball squeeze 10x10sec Supine hip abd Tband 3x10 green    PATIENT EDUCATION:  Education details: exam findings, POC, HEP   Person educated: Patient Education method: Explanation, Demonstration, and Handouts Education comprehension: verbalized understanding, returned demonstration, and needs further education  HOME EXERCISE PROGRAM:  Access Code: [email or web address] URL: [email or web address] Date: 03/29/2024 Prepared by: Josette Rough  Exercises - Supine Bridge with Resistance Band  - 2 x daily - 7 x weekly - 1 sets - 10 reps - 2 seconds  hold - Supine Posterior Pelvic Tilt  - 2 x daily - 7 x weekly - 1 sets - 10 reps - 3-5 seconds  hold - Supine Lower Trunk Rotation  - 2 x daily - 7 x weekly - 1 sets - 10 reps - 3-5 seconds  hold - Supine March with Posterior Pelvic Tilt  - 1 x daily - 7 x weekly - 2 sets - 10 reps - Small Range SLR + Posterior Pelvic Tilt   - 1 x daily - 7 x weekly - 2 sets - 10 reps - Standing Transverse Abdominis Contraction  - 3 x daily - 7 x weekly - 10 reps - 3-5 seconds  hold - Seated Slump Nerve Glide  - 1 x daily - 7 x weekly - 3 sets - 10 reps (added by Susannah Daring 04/05/2024)  ASSESSMENT:  CLINICAL IMPRESSION: Patient arrived to session noting improvement in overall symptoms as well as improvement with muscle recruitment with hamstrings. Patient completed sciatic nerve glide at home with no noted improvements, but preferred to keep activity in HEP to continue adding movement to the area. Patient will continue to benefit from skilled PT.    OBJECTIVE IMPAIRMENTS: Abnormal gait, decreased mobility, difficulty walking, decreased strength, increased fascial restrictions, increased muscle spasms, impaired flexibility, postural dysfunction, and pain.   ACTIVITY LIMITATIONS: carrying, lifting, bending, sitting, standing, squatting, sleeping, stairs, transfers, and locomotion level  PARTICIPATION LIMITATIONS: meal prep, cleaning, laundry, shopping, community activity, occupation, and yard work  PERSONAL FACTORS: Education, Financial risk analyst, Past/current experiences, Profession, Social  background, and Time since onset of injury/illness/exacerbation are also affecting patient's functional outcome.   REHAB POTENTIAL: Good  CLINICAL DECISION MAKING: Stable/uncomplicated  EVALUATION COMPLEXITY: Low   GOALS: Goals reviewed with patient? No  SHORT TERM GOALS: Target date: 04/05/2024      Will be compliant with appropriate progressive HEP GOAL STATUS: met 03/29/24   2. Will demonstrate improved postural awareness with all functional tasks, use of ergonomic aides PRN/as desired GOAL STATUS: ongoing 03/29/24   3. Will demonstrate good functional biomechanics for bed mobility and floor to waist lifting mechanics  GOAL STATUS: ongoing 03/29/24  4. Lumbar AROM to be full and without feelings of stiffness or increased pain GOAL STATUS: ongoing 03/29/24    LONG TERM GOALS: Target date: 05/03/2024    MMT to have improved by one grade all weak groups GOAL STATUS: Initial  2. Mm flexibility and spasms to have improved by at least 50% in order to improve functional movement patterns and for pain control GOAL STATUS: Initial  3. Pain to be no more than 1/10 with all functional activities including exercise as desired  GOAL STATUS: Initial   4. Will have improved score on PSFS  by at least 3 points to show improved QOL and subjective perception of condition    PLAN:  PT FREQUENCY: 1x/week  PT DURATION: 8 weeks  PLANNED INTERVENTIONS: 97750- Physical Performance Testing, 97110-Therapeutic exercises,  02469- Therapeutic activity, [id number]- Neuromuscular re-education, [id number]- Self Care, [id number]- Manual therapy, [id number]- Aquatic Therapy, and 20560 (1-2 muscles), 20561 (3+ muscles)- Dry Needling.  PLAN FOR NEXT SESSION:  manual to lateral calf as appropriate (was asking about dry needling, aware of extra charge for this and seems agreeable), postural training, assess gait and need for orthotic as needed, percussion gun as desired   Susannah Daring, PT, DPT 04/12/24 2:46 PM

## 2024-04-12 ENCOUNTER — Ambulatory Visit

## 2024-04-12 DIAGNOSIS — R29898 Other symptoms and signs involving the musculoskeletal system: Secondary | ICD-10-CM

## 2024-04-12 DIAGNOSIS — M79661 Pain in right lower leg: Secondary | ICD-10-CM | POA: Diagnosis not present

## 2024-04-12 DIAGNOSIS — R293 Abnormal posture: Secondary | ICD-10-CM

## 2024-04-12 DIAGNOSIS — M6281 Muscle weakness (generalized): Secondary | ICD-10-CM

## 2024-04-12 DIAGNOSIS — M5459 Other low back pain: Secondary | ICD-10-CM | POA: Diagnosis not present

## 2024-04-17 NOTE — Telephone Encounter (Signed)
 Bp  diastolic a bit high  Make sure y ou get your lab appt before visit ( orders are in  system) Also bring your home  BP monitor to your visit in September to confirm correlation with our  office readings

## 2024-04-18 NOTE — Therapy (Signed)
 OUTPATIENT PHYSICAL THERAPY THORACOLUMBAR TREATMENT   Patient Name: Michele Andersen MRN: [id number] DOB:Jan 31, 1976, 48 y.o., female Today's Date: 04/19/2024  END OF SESSION:  PT End of Session - 04/19/24 1301     Visit Number 7    Number of Visits 9    Date for PT Re-Evaluation 05/03/24    Authorization Type [employer]    Authorization Time Period 03/08/24 to 05/03/24    Authorization - Number of Visits 20    PT Start Time 1301    PT Stop Time 1345    PT Time Calculation (min) 44 min    Activity Tolerance Patient tolerated treatment well    Behavior During Therapy [facility] for tasks assessed/performed            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   DILITATION & CURRETTAGE/HYSTROSCOPY WITH HYDROTHERMAL ABLATION N/A 07/21/2022   Procedure: DILATATION & CURETTAGE/HYSTEROSCOPY WITH HYDROTHERMAL ABLATION;  Surgeon: Barbette Knock, MD;  Location: [facility];  Service: Gynecology;  Laterality: N/A;   ELBOW SURGERY Right 12/07/2015   @[facility]   by dr d. sebastian;    RIGHT EPICONDYLAR DEBRIDEMENT AND RADIAL  NEUROPLASTY   WRIST GANGLION EXCISION Right 12/13/2019   @[facility]    by   dr d. 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   Allergic rhinitis 08/12/2010   DEPRESSION 07/27/2007   EUSTACHIAN TUBE DYSFUNCTION 07/27/2007   SINUSITIS- ACUTE-NOS 07/27/2007   ECZEMA 07/27/2007    PCP: Charlett Howard MD   REFERRING PROVIDER: Joane Artist RAMAN, MD  REFERRING DIAG: Diagnosis M54.41,G89.29 (ICD-10-CM) - Chronic low back pain with right-sided sciatica, unspecified back pain laterality  Rationale for Evaluation and Treatment: Rehabilitation  THERAPY DIAG:  Other low back pain  Pain in right lower leg  Abnormal posture  Other symptoms and signs involving the musculoskeletal system  Muscle weakness (generalized)  ONSET DATE: chronic   SUBJECTIVE:  SUBJECTIVE STATEMENT: Patient endorses improved pain in Lt calf and having the ability to walk longer distances.   PERTINENT HISTORY:  Eval: I've had problems for years in my lower back, usually I can work it out myself. See a chiropractor monthly for maintenance. Clemens down steps in January and broke my arm, wasn't able to stretch regularly. Having pain in my calf but it seems separate from sciatica pain. Chiropractor  told me calf is very tight too. I've done stretches in my own home, tried foam rollers, tennis balls, TENS, ice, heat, voltaren, chiropractic adjustments, traction.   PAIN:  Are you having pain? Yes: NPRS scale: 3/10 Pain location: low back  Pain description: tight, squeezing, small amounts of nerve pain earlier but not now   Aggravating factors:  sitting for >30 minutes, cleaning  Relieving factors:  laying flat, decompressing back   PRECAUTIONS: None  RED FLAGS: None   WEIGHT BEARING RESTRICTIONS: No  FALLS:  Has patient fallen in last 6 months? 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 Morene Mace is taking over her care during this period)  OBJECTIVE:  Note: Objective measures were completed at Evaluation unless otherwise noted.  DIAGNOSTIC FINDINGS:   CLINICAL DATA:  Low back pain for years.   EXAM: LUMBAR SPINE - 2-3 VIEW   COMPARISON:  None Available.   FINDINGS: There is no evidence of lumbar spine fracture. Minimal curvature of spine. Minimal anterior spurring noted at L3 and L4. Intervertebral disc spaces are maintained.   IMPRESSION: Minimal degenerative joint changes of lumbar spine.  PATIENT SURVEYS:  PSFS: THE PATIENT SPECIFIC FUNCTIONAL SCALE  Place score of 0-10 (0 = unable to perform activity and 10 = able to perform activity at the same level as before injury or problem)  Activity Date: 03/08/24 Eval     Sitting over 30 minutes  5    2. Walking over 20 minutes  5    3.Turning head to the right when bending  4    4. Twisting  5    Total Score 4.75      Total Score = Sum of activity scores/number of activities  Minimally Detectable Change: 3 points (for single activity); 2 points (for average score)  [facility] (nd). The Patient Specific Functional Scale . Retrieved from  [email or web address]   COGNITION: Overall cognitive status: Within functional limits for tasks assessed     SENSATION: Some reports of very short zips of electricity from lateral calf down, rarely happens from hip/back down   MUSCLE LENGTH:  Quads OK, hip flexors OK, HS and piriformis OK B, SIJ in alignment per basic screen   POSTURE: rounded shoulders, forward head, decreased lumbar lordosis, and increased thoracic kyphosis  PALPATION: Deep mm spasms noted lateral calf R LE   LUMBAR ROM:   AROM eval 03/29/24  Flexion Full ROM   Extension Full ROM but stiff feeling to her    Right lateral flexion WNL  WNL   Left lateral flexion WNL  WNL   Right rotation 50% limited  25% limited   Left rotation 50%  limited  25% limited    (Blank rows = not tested)    LOWER EXTREMITY MMT:    MMT Right eval Left eval Right 03/29/24 Left 03/29/24  Hip flexion 4+ 4+ 5 5  Hip extension 3- 3 4- 4-  Hip abduction 4+ 3+ 4+ 4-  Hip adduction      Hip internal rotation      Hip external rotation      Knee flexion 4 4 5 5   Knee extension 4+ 4+ 5 5  Ankle dorsiflexion      Ankle plantarflexion      Ankle inversion      Ankle eversion       (Blank rows = not tested)  LUMBAR SPECIAL TESTS:  SIJ in alignment, slump test (-) but does seem to have some mm tightness      TREATMENT DATE: 04/19/2024 TherEx:  Nustep seat 5, level 5 for 6 minutes  Discussed HEP to include what to do following session and when to continue with full program   Neuro Re-Ed:  Single leg stance on foam and performing square taps 2x10  Intermittent UE use throughout secondary to minimal lateral LOB  Single leg stance with slider under one foot performing fwd, lateral, back slides 2x5 each leg  Verbal cues for lateral moment in Rt hip with Lt slide, moderate carryover  Balance board fwd/back 2x10 with focus on control of movement   Manual: IASTM with percussion  device to right gastroc and hamstring  Discussed use of dry needling at next appointment   04/12/2024 TherEx:  Recumbent bike level 4 for 6 minutes  Discussed HEP and provided green and blue TB to increase intensity of supine bridges  Squats with red TB around knees for abduction cue 2x10   Neuro Re-Ed Single leg stance on foam performing square taps x5 each leg  Fwd/backward on square balance board with intermittent UE use in parallel bars 2x10; focus on control movement and balance  Side to side on square balance board 1x10 ; focus on control of movement and balance  Tandem stance to challenge ankle strategy 2x25s   Manual IASTM with percussion device to right gastroc and hamstring    04/08/2024 TherEx:  Nustep seat 5, level 5 for 8 minutes with bilat LE only Slant board gastroc stretch 3x30s Lateral walks with red TB around knees 3x10 each direction    Neuro Re-Ed:  Assessment of tandem stance on firm surface with intermittent use of UE to self correct increased sway and minor lateral LOB  Assessment of narrow stance balance on compliant surface; increased sway but no LOB or use of UEs Tandem stance 3x20s each leg in back with decreased use for UEs with time  Single leg stance on foam performing square taps x8 each leg  Discussed 3 balance systems as well as balance strategies and how it all interacts  Discussed and performed sciatic nerve glide to add to HEP for when patient is experiencing radiating pain   Manual IASTM with percussion device to right gastroc and hamstring     PATIENT EDUCATION:  Education details: exam findings, POC, HEP  Person educated: Patient Education method: Programmer, multimedia, Demonstration, and Handouts Education comprehension: verbalized understanding, returned demonstration, and needs further education  HOME EXERCISE PROGRAM:  Access Code: [email or web address] URL: [email or web address] Date: 03/29/2024 Prepared by: Josette Rough  Exercises - Supine Bridge with Resistance Band  - 2 x daily - 7 x weekly - 1 sets - 10 reps - 2  seconds  hold - Supine Posterior Pelvic Tilt  - 2 x daily - 7 x weekly - 1 sets - 10 reps - 3-5 seconds  hold - Supine Lower Trunk Rotation  - 2 x daily - 7 x weekly - 1 sets - 10 reps - 3-5 seconds  hold - Supine March with Posterior Pelvic Tilt  - 1 x daily - 7 x weekly - 2 sets - 10 reps - Small Range SLR + Posterior Pelvic Tilt   - 1 x daily - 7 x weekly - 2 sets - 10 reps - Standing Transverse Abdominis Contraction  - 3 x daily - 7 x weekly - 10 reps - 3-5 seconds  hold - Seated Slump Nerve Glide  - 1 x daily - 7 x weekly - 3 sets - 10 reps (added by Susannah Daring 04/05/2024)  ASSESSMENT:  CLINICAL IMPRESSION: Patient arrived to session noting decreased deep pain in calf and improved walking ability for endurance. Patient with improved performance during balance activities this date using less UE support throughout. Patient will benefit from continued skilled PT.   OBJECTIVE IMPAIRMENTS: Abnormal gait, decreased mobility, difficulty walking, decreased strength, increased fascial restrictions, increased muscle spasms, impaired flexibility, postural dysfunction, and pain.   ACTIVITY LIMITATIONS: carrying, lifting, bending, sitting, standing, squatting, sleeping, stairs, transfers, and locomotion level  PARTICIPATION LIMITATIONS: meal prep, cleaning, laundry, shopping, community activity, occupation, and yard work  PERSONAL FACTORS: Education, Financial risk analyst, Past/current experiences, Profession, Social background, and Time since onset of injury/illness/exacerbation are also affecting patient's functional outcome.   REHAB POTENTIAL: Good  CLINICAL DECISION MAKING: Stable/uncomplicated  EVALUATION COMPLEXITY: Low   GOALS: Goals reviewed with patient? No  SHORT TERM GOALS: Target date: 04/05/2024      Will be compliant with appropriate progressive HEP GOAL STATUS: met 03/29/24   2.  Will demonstrate improved postural awareness with all functional tasks, use of ergonomic aides PRN/as desired GOAL STATUS: ongoing 03/29/24   3. Will demonstrate good functional biomechanics for bed mobility and floor to waist lifting mechanics  GOAL STATUS: ongoing 03/29/24  4. Lumbar AROM to be full and without feelings of stiffness or increased pain GOAL STATUS: ongoing 03/29/24    LONG TERM GOALS: Target date: 05/03/2024    MMT to have improved by one grade all weak groups GOAL STATUS: Initial  2. Mm flexibility and spasms to have improved by at least 50% in order to improve functional movement patterns and for pain control GOAL STATUS: Initial  3. Pain to be no more than 1/10 with all functional activities including exercise as desired  GOAL STATUS: Initial   4. Will have improved score on PSFS  by at least 3 points to show improved QOL and subjective perception of condition    PLAN:  PT FREQUENCY: 1x/week  PT DURATION: 8 weeks  PLANNED INTERVENTIONS: 97750- Physical Performance Testing, 97110-Therapeutic exercises, 97530- Therapeutic activity, [id number]- Neuromuscular re-education, 97535- Self Care, [id number]- Manual therapy, [id number]- Aquatic Therapy, and 20560 (1-2 muscles), 20561 (3+ muscles)- Dry Needling.  PLAN FOR NEXT SESSION:  postural training, assess gait and need for orthotic as needed, manual with percussion device/dry needling, hip extension exercises (potentially single leg)  Susannah Daring, PT, DPT 04/19/24 3:34 PM

## 2024-04-19 ENCOUNTER — Ambulatory Visit

## 2024-04-19 DIAGNOSIS — M79661 Pain in right lower leg: Secondary | ICD-10-CM

## 2024-04-19 DIAGNOSIS — M5459 Other low back pain: Secondary | ICD-10-CM

## 2024-04-19 DIAGNOSIS — R29898 Other symptoms and signs involving the musculoskeletal system: Secondary | ICD-10-CM | POA: Diagnosis not present

## 2024-04-19 DIAGNOSIS — R293 Abnormal posture: Secondary | ICD-10-CM | POA: Diagnosis not present

## 2024-04-19 DIAGNOSIS — M6281 Muscle weakness (generalized): Secondary | ICD-10-CM

## 2024-04-23 ENCOUNTER — Other Ambulatory Visit: Payer: Self-pay | Admitting: Medical Genetics

## 2024-04-24 ENCOUNTER — Encounter: Payer: Self-pay | Admitting: Rehabilitative and Restorative Service Providers"

## 2024-04-24 ENCOUNTER — Ambulatory Visit: Payer: Self-pay | Admitting: Rehabilitative and Restorative Service Providers"

## 2024-04-24 DIAGNOSIS — R29898 Other symptoms and signs involving the musculoskeletal system: Secondary | ICD-10-CM | POA: Diagnosis not present

## 2024-04-24 DIAGNOSIS — M6281 Muscle weakness (generalized): Secondary | ICD-10-CM

## 2024-04-24 DIAGNOSIS — R293 Abnormal posture: Secondary | ICD-10-CM | POA: Diagnosis not present

## 2024-04-24 DIAGNOSIS — M79661 Pain in right lower leg: Secondary | ICD-10-CM | POA: Diagnosis not present

## 2024-04-24 DIAGNOSIS — M5459 Other low back pain: Secondary | ICD-10-CM | POA: Diagnosis not present

## 2024-04-24 NOTE — Therapy (Signed)
 OUTPATIENT PHYSICAL THERAPY TREATMENT   Patient Name: Michele Andersen MRN: [id number] DOB:Apr 11, 1976, 48 y.o., female Today's Date: 04/24/2024  END OF SESSION:  PT End of Session - 04/24/24 1522     Visit Number 8    Number of Visits 9    Date for PT Re-Evaluation 05/03/24    Authorization Type [employer]    Authorization Time Period 03/08/24 to 05/03/24    Authorization - Number of Visits 20    PT Start Time 1520    PT Stop Time 1600    PT Time Calculation (min) 40 min    Activity Tolerance Patient tolerated treatment well    Behavior During Therapy [facility] for tasks assessed/performed             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   DILITATION & CURRETTAGE/HYSTROSCOPY WITH HYDROTHERMAL ABLATION N/A 07/21/2022   Procedure: DILATATION & CURETTAGE/HYSTEROSCOPY WITH HYDROTHERMAL ABLATION;  Surgeon: Barbette Knock, MD;  Location: [facility];  Service: Gynecology;  Laterality: N/A;   ELBOW SURGERY Right 12/07/2015   @[facility]   by dr d. sebastian;    RIGHT EPICONDYLAR DEBRIDEMENT AND RADIAL NEUROPLASTY    WRIST GANGLION EXCISION Right 12/13/2019   @[facility]    by   dr d. 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   Allergic rhinitis 08/12/2010   DEPRESSION 07/27/2007   EUSTACHIAN TUBE DYSFUNCTION 07/27/2007   SINUSITIS- ACUTE-NOS 07/27/2007   ECZEMA 07/27/2007    PCP: Charlett Howard MD   REFERRING PROVIDER: Joane Artist RAMAN, MD  REFERRING DIAG: Diagnosis M54.41,G89.29 (ICD-10-CM) - Chronic low back pain with right-sided sciatica, unspecified back pain laterality  Rationale for Evaluation and Treatment: Rehabilitation  THERAPY DIAG:  Other low back pain  Pain in right lower leg  Abnormal posture  Other symptoms and signs involving the musculoskeletal system  Muscle weakness (generalized)  ONSET DATE: chronic   SUBJECTIVE:  SUBJECTIVE STATEMENT: Pt indicated not having as good of a week in last week.  Reported complaints in Rt buttock and back of thigh and reported calf tightness and squeezing noted.     PERTINENT HISTORY:  Eval: I've had problems for years in my lower back, usually I can work it out myself. See a chiropractor monthly for maintenance. Clemens down steps in January and broke my arm, wasn't able to stretch regularly. Having pain in my calf but it seems separate from sciatica pain. Chiropractor  told me calf is very tight too. I've done stretches in my own home, tried foam rollers, tennis balls, TENS, ice, heat, voltaren, chiropractic adjustments, traction.   PAIN:  NPRS scale: upon arrival calf 6/10, Rt posterior buttock/leg 4/10.  Pain location: low back  Pain  description: tight, squeezing, small amounts of nerve pain earlier but not now  Aggravating factors:  sitting for >30 minutes, cleaning  Relieving factors:  laying flat, decompressing back   PRECAUTIONS: None  RED FLAGS: None   WEIGHT BEARING RESTRICTIONS: No  FALLS:  Has patient fallen in last 6 months? 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 Morene Mace is taking over her care during this period)  OBJECTIVE:  Note: Objective measures were completed at Evaluation unless otherwise noted.  DIAGNOSTIC FINDINGS:   CLINICAL DATA:  Low back pain for years.   EXAM: LUMBAR SPINE - 2-3 VIEW   COMPARISON:  None Available.   FINDINGS: There is no evidence of lumbar spine fracture. Minimal curvature of spine. Minimal anterior spurring noted at L3 and L4. Intervertebral disc spaces are maintained.   IMPRESSION: Minimal degenerative joint changes of lumbar spine.  PATIENT SURVEYS:  PSFS: THE PATIENT SPECIFIC FUNCTIONAL SCALE  Place score of 0-10 (0 = unable to perform activity and 10 = able to perform activity at the same level as before injury or problem)  Activity Date: 03/08/24 Eval  04/24/2024   Sitting over 30 minutes  5 6   2. Walking over 20 minutes  5 5   3.Turning head to the right when bending  4 10   4. Twisting  5 9   Total Score 4.75 7.5 avg     Total Score = Sum of activity scores/number of activities  Minimally Detectable Change: 3 points (for single activity); 2 points (for average score)   COGNITION: Overall cognitive status: Within  functional limits for tasks assessed     SENSATION: Eval Some reports of very short zips of electricity from lateral calf down, rarely happens from hip/back down   MUSCLE LENGTH: Eval Quads OK, hip flexors OK, HS and piriformis OK B, SIJ in alignment per basic screen   POSTURE:  Eval rounded shoulders, forward head, decreased lumbar lordosis, and increased thoracic kyphosis  PALPATION: Eval Deep mm spasms noted lateral calf R LE   LUMBAR ROM:   AROM eval 03/29/24  Flexion Full ROM   Extension Full ROM but stiff feeling to her    Right lateral flexion WNL  WNL   Left lateral flexion WNL  WNL   Right rotation 50%  limited  25% limited   Left rotation 50% limited  25% limited    (Blank rows = not tested)    LOWER EXTREMITY MMT:    MMT Right eval Left eval Right 03/29/24 Left 03/29/24  Hip flexion 4+ 4+ 5 5  Hip extension 3- 3 4- 4-  Hip abduction 4+ 3+ 4+ 4-  Hip adduction      Hip internal rotation      Hip external rotation      Knee flexion 4 4 5 5   Knee extension 4+ 4+ 5 5  Ankle dorsiflexion      Ankle plantarflexion      Ankle inversion      Ankle eversion       (Blank rows = not tested)  LUMBAR SPECIAL TESTS:  Eval:  SIJ in alignment, slump test (-) but does seem to have some mm tightness                     TREATMENT        DATE: 93/2025 Therex: Supine figure 4 pull towards stretch 30 sec x 3 Rt leg  Supine Rt leg [facility] education review Supine Rt leg sciatic nerve flossing with knee extension with PF , knee flexion with DF x 10 Supine bridge 2-3 sec hold 2 x 10  Nustep Lvl 5 LE only 6 mins    Manual: Percussive device to Rt glute max, piriformis as well as Rt lateral and medial calf.   Trigger Point Dry Needling  Initial Treatment: Pt instructed on Dry Needling rational, procedures, and possible side effects.  Patient Verbal Consent Given: Yes Education Handout Provided: Yes Muscles Treated: Rt glute max, Rt lateral gastroc  Treatment  Response/Outcome: local twitch response Self care education performed with DN.   Self Care Education verbally on post manual and needling soreness possibility and effective strategy to help address and improve following visit.  Strategies included but not limited to:  heat/ice prn, increased water intake, use of HEP and general mobility to move muscle soreness out.  Pt voiced understanding.      TREATMENT        DATE: 04/19/2024 TherEx:  Nustep seat 5, level 5 for 6 minutes  Discussed HEP to include what to do following session and when to continue with full program   Neuro Re-Ed:  Single leg stance on foam and performing square taps 2x10  Intermittent UE use throughout secondary to minimal lateral LOB  Single leg stance with slider under one foot performing fwd, lateral, back slides 2x5 each leg  Verbal cues for lateral moment in Rt hip with Lt slide, moderate carryover  Balance board fwd/back 2x10 with focus on control of movement   Manual: IASTM with percussion device to right gastroc and hamstring  Discussed use of dry needling at next appointment   TREATMENT        DATE:04/12/2024 TherEx:  Recumbent bike level 4 for 6 minutes  Discussed HEP and provided green and blue TB to increase intensity of supine bridges  Squats with red TB around knees for abduction cue 2x10   Neuro Re-Ed Single leg stance on foam performing square taps x5 each leg  Fwd/backward on square balance board with intermittent UE use in parallel bars 2x10; focus on control movement and balance  Side to side on square balance board 1x10 ; focus on control of movement and balance  Tandem stance to challenge ankle strategy 2x25s   Manual IASTM with percussion  device to right gastroc and hamstring    TREATMENT        DATE:04/08/2024 TherEx:  Nustep seat 5, level 5 for 8 minutes with bilat LE only Slant board gastroc stretch 3x30s Lateral walks with red TB around knees 3x10 each direction    Neuro  Re-Ed:  Assessment of tandem stance on firm surface with intermittent use of UE to self correct increased sway and minor lateral LOB  Assessment of narrow stance balance on compliant surface; increased sway but no LOB or use of UEs Tandem stance 3x20s each leg in back with decreased use for UEs with time  Single leg stance on foam performing square taps x8 each leg  Discussed 3 balance systems as well as balance strategies and how it all interacts  Discussed and performed sciatic nerve glide to add to HEP for when patient is experiencing radiating pain   Manual IASTM with percussion device to right gastroc and hamstring     PATIENT EDUCATION:  Education details: exam findings, POC, HEP  Person educated: Patient Education method: Programmer, multimedia, Demonstration, and Handouts Education comprehension: verbalized understanding, returned demonstration, and needs further education  HOME EXERCISE PROGRAM:  Access Code: [email or web address] URL: [email or web address] Date: 03/29/2024 Prepared by: Josette Rough  Exercises - Supine Bridge with Resistance Band  - 2 x daily - 7 x weekly - 1 sets - 10 reps - 2 seconds  hold - Supine Posterior Pelvic Tilt  - 2 x daily - 7 x weekly - 1 sets - 10 reps - 3-5 seconds  hold - Supine Lower Trunk Rotation  - 2 x daily - 7 x weekly - 1 sets - 10 reps - 3-5 seconds  hold - Supine March with Posterior Pelvic Tilt  - 1 x daily - 7 x weekly - 2 sets - 10 reps - Small Range SLR + Posterior Pelvic Tilt   - 1 x daily - 7 x weekly - 2 sets - 10 reps - Standing Transverse Abdominis Contraction  - 3 x daily - 7 x weekly - 10 reps - 3-5 seconds  hold - Seated Slump Nerve Glide  - 1 x daily - 7 x weekly - 3 sets - 10 reps (added by Susannah Daring 04/05/2024)  ASSESSMENT:  CLINICAL IMPRESSION: Patient arrived to session noting decreased deep pain in calf and improved walking ability for endurance. Patient with improved performance during balance activities this date  using less UE support throughout. Patient will benefit from continued skilled PT.   OBJECTIVE IMPAIRMENTS: Abnormal gait, decreased mobility, difficulty walking, decreased strength, increased fascial restrictions, increased muscle spasms, impaired flexibility, postural dysfunction, and pain.   ACTIVITY LIMITATIONS: carrying, lifting, bending, sitting, standing, squatting, sleeping, stairs, transfers, and locomotion level  PARTICIPATION LIMITATIONS: meal prep, cleaning, laundry, shopping, community activity, occupation, and yard work  PERSONAL FACTORS: Education, Financial risk analyst, Past/current experiences, Profession, Social background, and Time since onset of injury/illness/exacerbation are also affecting patient's functional outcome.   REHAB POTENTIAL: Good  CLINICAL DECISION MAKING: Stable/uncomplicated  EVALUATION COMPLEXITY: Low   GOALS: Goals reviewed with patient? No  SHORT TERM GOALS: Target date: 04/05/2024      Will be compliant with appropriate progressive HEP GOAL STATUS: met 03/29/24   2. Will demonstrate improved postural awareness with all functional tasks, use of ergonomic aides PRN/as desired GOAL STATUS: Met   3. Will demonstrate good functional biomechanics for bed mobility and floor to waist lifting mechanics  GOAL STATUS: Met  4. Lumbar AROM to be full and without feelings  of stiffness or increased pain GOAL STATUS: partially met    LONG TERM GOALS: Target date: 05/03/2024    MMT to have improved by one grade all weak groups GOAL STATUS: on going 04/24/2024  2. Mm flexibility and spasms to have improved by at least 50% in order to improve functional movement patterns and for pain control GOAL STATUS: on going 04/24/2024  3. Pain to be no on going 04/24/2024 than 1/10 with all functional activities including exercise as desired  GOAL STATUS: Initial   4. Will have improved score on PSFS  by at least 3 points to show improved QOL and subjective perception of  condition  On going  04/24/2024   PLAN:  PT FREQUENCY: 1x/week  PT DURATION: 8 weeks  PLANNED INTERVENTIONS: 97750- Physical Performance Testing, 97110-Therapeutic exercises, 97530- Therapeutic activity, [id number]- Neuromuscular re-education, 97535- Self Care, 02859- Manual therapy, [id number]- Aquatic Therapy, and 20560 (1-2 muscles), 20561 (3+ muscles)- Dry Needling.  PLAN FOR NEXT SESSION:  Check up on DN response.   Recert on or after 05/03/2024.   Ozell Silvan, PT, DPT, OCS, ATC 04/24/24  3:58 PM

## 2024-04-25 ENCOUNTER — Encounter: Admitting: Internal Medicine

## 2024-04-26 LAB — HM MAMMOGRAPHY

## 2024-05-01 LAB — HM PAP SMEAR: HPV, high-risk: NEGATIVE

## 2024-05-02 NOTE — Progress Notes (Unsigned)
 Michele Jackson [name] [address] [place] [address] Phone: [phone]   Assessment and Plan:     1. Chronic right-sided low back pain with right-sided sciatica (Primary) 2. Right calf pain -Chronic with exacerbation, subsequent visit - Overall improvement in multiple symptoms including decreased back pain, decrease strain through right thigh and calf.  Most consistent with degenerative changes at L5-S1 leading to intermittent radicular symptoms and right calf strain due to compensatory mechanisms.  Moderate improvement with NSAID course, continued HEP and physical therapy - Continue HEP and physical therapy including dry needling - Use meloxicam  15 mg daily as needed for pain.  Recommend limiting chronic NSAIDs to 1-2 doses per week to prevent long-term side effects.  May call for refill if needed - If symptoms worsen, could consider prednisone  Dosepak versus MRI     Pertinent previous records reviewed include none   Follow Up: 4 to 8 weeks for reevaluation.  If no improvement or worsening of symptoms, could consider prednisone  Dosepak versus MRI versus OMT discussion   Subjective:   I, Michele Andersen, am serving as a Neurosurgeon for Doctor Morene Mace   Chief Complaint: back pain    HPI:    02/14/2024 Michele Andersen is a 48 y.o. female who presents to [facility] today for low back and R lower leg pain ongoing since mid-March. Pt locates pain to right side lower back radiating into the R LE. Experiencing more pain in the calf. Denies increased warmth, swelling, erythema. Sx have responded well with Chiro treatments and massage. Broke humerus in Jan, feels that sx were exacerbated just before coming out of the cast, was unable to do self body work.    Radiating pain: R LE LE numbness/tingling: denies LE weakness: intermittent Aggravates: sx are constant, ambulation Treatments tried: topical  NSAIDs, Chiropractic adjustments   03/27/2024 Patient states she is a little better. Stabbing pain in the calf . PT has helped with the back not so much the calf    05/03/2024 Patient states she is a little bit better   Relevant Historical Information: None pertinent  Additional pertinent review of systems negative.   Current Outpatient Medications:    cetirizine (ZYRTEC) 10 MG tablet, Take 10 mg by mouth daily., Disp: , Rfl:    escitalopram  (LEXAPRO ) 10 MG tablet, TAKE 1 TABLET(10 MG) BY MOUTH DAILY, Disp: 90 tablet, Rfl: 2   hydrOXYzine  (VISTARIL ) 25 MG capsule, TAKE 1 CAPSULE(25 MG) BY MOUTH EVERY 8 HOURS AS NEEDED FOR ANXIETY, Disp: 30 capsule, Rfl: 1   meloxicam  (MOBIC ) 15 MG tablet, Take 1 tablet (15 mg total) by mouth daily as needed for pain. Take 1 tablet daily for 2 weeks.  If still in pain after 2 weeks, take 1 tablet daily for an additional 1 week., Disp: 30 tablet, Rfl: 0   Multiple Vitamins-Minerals (MULTIVITAMIN WITH MINERALS) tablet, Take 1 tablet by mouth daily., Disp: , Rfl:    triamcinolone  cream (KENALOG ) 0.1 %, Apply 1 Application topically 2 (two) times daily. For eczema, Disp: 30 g, Rfl: 1   Objective:     Vitals:   05/03/24 1402  Pulse: 84  SpO2: 94%  Weight: 148 lb (67.1 kg)  Height: 5' 3 (1.6 m)      Body mass index is 26.22 kg/m.    Physical Exam:    Gen: Appears well, nad, nontoxic and pleasant Psych: Alert and oriented, appropriate mood and affect Neuro: sensation intact,  strength is 5/5 in upper and lower extremities, muscle tone wnl Skin: no susupicious lesions or rashes   Back - Normal skin, Spine with normal alignment and no deformity.   Mild tenderness to vertebral process palpation.   Paraspinous muscles are not tender and without spasm Mild bilateral TTP gluteal musculature Straight leg raise positive right Trendelenberg positive right Piriformis Test negative Gait normal    Electronically signed by:  Odis Mace [name] 2:14 PM 05/03/24

## 2024-05-03 ENCOUNTER — Ambulatory Visit (INDEPENDENT_AMBULATORY_CARE_PROVIDER_SITE_OTHER): Admitting: Sports Medicine

## 2024-05-03 ENCOUNTER — Other Ambulatory Visit: Payer: Self-pay | Admitting: Family

## 2024-05-03 VITALS — HR 84 | Ht 63.0 in | Wt 148.0 lb

## 2024-05-03 DIAGNOSIS — M5441 Lumbago with sciatica, right side: Secondary | ICD-10-CM

## 2024-05-03 DIAGNOSIS — M79661 Pain in right lower leg: Secondary | ICD-10-CM | POA: Diagnosis not present

## 2024-05-03 DIAGNOSIS — G8929 Other chronic pain: Secondary | ICD-10-CM | POA: Diagnosis not present

## 2024-05-03 MED ORDER — MELOXICAM 15 MG PO TABS: 15.0000 mg | ORAL_TABLET | Freq: Every day | ORAL | 0 refills | Status: DC | PRN

## 2024-05-03 NOTE — Patient Instructions (Addendum)
-   Use meloxicam  15 mg daily as needed for pain.  Recommend limiting chronic NSAIDs to 1-2 doses per week to prevent long-term side effects.  Refill meloxicam     Can call and ask for prednisone  dos pak   Continue HEP and PT  6-8 week follow up

## 2024-05-08 ENCOUNTER — Encounter: Payer: Self-pay | Admitting: Physical Therapy

## 2024-05-08 ENCOUNTER — Ambulatory Visit: Admitting: Physical Therapy

## 2024-05-08 DIAGNOSIS — M79661 Pain in right lower leg: Secondary | ICD-10-CM | POA: Diagnosis not present

## 2024-05-08 DIAGNOSIS — M6281 Muscle weakness (generalized): Secondary | ICD-10-CM

## 2024-05-08 DIAGNOSIS — R29898 Other symptoms and signs involving the musculoskeletal system: Secondary | ICD-10-CM

## 2024-05-08 DIAGNOSIS — M5459 Other low back pain: Secondary | ICD-10-CM

## 2024-05-08 DIAGNOSIS — R293 Abnormal posture: Secondary | ICD-10-CM

## 2024-05-08 NOTE — Therapy (Signed)
 OUTPATIENT PHYSICAL THERAPY TREATMENT RECERTIFICATION   Patient Name: Michele Andersen MRN: [id number] DOB:October 11, 1975, 48 y.o., female Today's Date: 05/08/2024  END OF SESSION:  PT End of Session - 05/08/24 1146     Visit Number 9    Number of Visits 12    Date for PT Re-Evaluation 06/05/24    Authorization Type [employer]    Authorization - Number of Visits 20    PT Start Time 1145    PT Stop Time 1225    PT Time Calculation (min) 40 min    Activity Tolerance Patient tolerated treatment well    Behavior During Therapy WFL for tasks assessed/performed              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   DILITATION & CURRETTAGE/HYSTROSCOPY WITH HYDROTHERMAL ABLATION N/A 07/21/2022   Procedure: DILATATION & CURETTAGE/HYSTEROSCOPY WITH HYDROTHERMAL ABLATION;  Surgeon: Barbette Knock, MD;  Location: [facility];  Service: Gynecology;  Laterality: N/A;   ELBOW SURGERY Right 12/07/2015   @[facility]   by dr d. sebastian;    RIGHT EPICONDYLAR DEBRIDEMENT AND RADIAL NEUROPLASTY   WRIST GANGLION EXCISION Right  12/13/2019   @[facility]    by   dr d. 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   Allergic rhinitis 08/12/2010   DEPRESSION 07/27/2007   EUSTACHIAN TUBE DYSFUNCTION 07/27/2007   SINUSITIS- ACUTE-NOS 07/27/2007   ECZEMA 07/27/2007    PCP: Charlett Howard MD   REFERRING PROVIDER: Joane Artist RAMAN, MD  REFERRING DIAG: Diagnosis M54.41,G89.29 (ICD-10-CM) - Chronic low back pain with right-sided sciatica, unspecified back pain laterality  Rationale for Evaluation and Treatment: Rehabilitation  THERAPY DIAG:  Other low back pain - Plan: PT plan of care cert/re-cert  Pain in right lower leg - Plan: PT plan of care cert/re-cert  Abnormal posture - Plan: PT plan of care cert/re-cert  Other symptoms and signs involving the musculoskeletal system - Plan: PT plan of care cert/re-cert  Muscle weakness (generalized) - Plan: PT plan of care cert/re-cert  ONSET DATE: chronic   SUBJECTIVE:  SUBJECTIVE STATEMENT: Dry needling was helpful in the calf and glute.  Now having some Lt sided pain.  Pain is 4/10 on average but is variable. Spasms have improved 70%    PERTINENT HISTORY:  Eval: I've had problems for years in my lower back, usually I can work it out myself. See a chiropractor monthly for maintenance. Clemens down steps in January and broke my arm, wasn't able to stretch regularly. Having pain in my calf but it seems separate from sciatica pain. Chiropractor  told me calf is very tight too. I've done stretches in my own home, tried foam rollers, tennis balls, TENS, ice, heat, voltaren,  chiropractic adjustments, traction.   PAIN:  NPRS scale: upon arrival calf 6/10, Rt posterior buttock/leg 4/10. 3/10 in bil glutes Pain location: low back  Pain description: tight, squeezing, small amounts of nerve pain earlier but not now  Aggravating factors:  sitting for >30 minutes, cleaning  Relieving factors:  laying flat, decompressing back   PRECAUTIONS: None  RED FLAGS: None   WEIGHT BEARING RESTRICTIONS: No  FALLS:  Has patient fallen in last 6 months? 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 Morene Mace is taking over her care during this period)  OBJECTIVE:  Note: Objective measures were completed at Evaluation unless otherwise noted.  DIAGNOSTIC FINDINGS:   CLINICAL DATA:  Low back pain for years.   EXAM: LUMBAR SPINE - 2-3 VIEW   COMPARISON:  None Available.   FINDINGS: There is no evidence of lumbar spine fracture. Minimal curvature of spine. Minimal anterior spurring noted at L3 and L4. Intervertebral disc spaces are maintained.   IMPRESSION: Minimal degenerative joint changes of lumbar spine.  PATIENT SURVEYS:  PSFS: THE PATIENT SPECIFIC FUNCTIONAL SCALE  Place score of 0-10 (0 = unable to perform activity and 10 = able to perform activity at the same level as before injury or problem)  Activity Date: 03/08/24  04/24/2024 05/08/24  Sitting over 30 minutes  5 6 5   2. Walking over 20 minutes  5 5 5   3.Turning head to the right when bending  4 10 9   4. Twisting  5 9 10   Total Score 4.75 7.5 avg 7.25    Total Score = Sum of  activity scores/number of activities  Minimally Detectable Change: 3 points (for single activity); 2 points (for average score)   COGNITION: Overall cognitive status: Within functional limits for tasks assessed     SENSATION: Eval Some reports of very short zips of electricity from lateral calf down, rarely happens from hip/back down   MUSCLE LENGTH: Eval Quads OK, hip flexors OK, HS and piriformis OK B, SIJ in alignment per basic screen   POSTURE:  Eval rounded shoulders, forward head, decreased lumbar lordosis, and increased thoracic kyphosis  PALPATION: Eval Deep mm spasms noted lateral calf R LE   LUMBAR ROM:   AROM eval 03/29/24  Flexion Full ROM   Extension Full ROM but stiff feeling to her    Right lateral flexion WNL  WNL   Left lateral flexion WNL  WNL   Right rotation  50% limited  25% limited   Left rotation 50% limited  25% limited    (Blank rows = not tested)    LOWER EXTREMITY MMT:    MMT Right eval Left eval Right 03/29/24 Left 03/29/24 Right/Left 05/08/24  Hip flexion 4+ 4+ 5 5   Hip extension 3- 3 4- 4- 4/4  Hip abduction 4+ 3+ 4+ 4- 4/4  Hip adduction       Hip internal rotation       Hip external rotation       Knee flexion 4 4 5 5    Knee extension 4+ 4+ 5 5   Ankle dorsiflexion       Ankle plantarflexion       Ankle inversion       Ankle eversion        (Blank rows = not tested)  LUMBAR SPECIAL TESTS:  Eval:  SIJ in alignment, slump test (-) but does seem to have some mm tightness                     TREATMENT 05/08/24 Manual: STM with compression to glutes; piriformis; Rt gastroc; skilled palpation and monitoring of soft tissue during DN.  Trigger Point Dry Needling  Subsequent Treatment: Instructions provided previously at initial dry needling treatment.   Patient Verbal Consent Given: Yes Education Handout Provided: Previously Provided Muscles Treated: Bil glute med, bil piriformis, Rt gastroc Electrical Stimulation Performed:  Yes, Parameters: bil glutes/piriformis 10 mA freq with intensity to tolerance x 3-5 min each Treatment Response/Outcome: twitch responses noted  Goal assessment and update; discussed/reviewed with pt Figure 4 glute bridge x 3 reps bil for HEP Sidelying hip circles x 5 reps CW/CCW bil for HEP        93/2025 Therex: Supine figure 4 pull towards stretch 30 sec x 3 Rt leg  Supine Rt leg SK[facility] education review Supine Rt leg sciatic nerve flossing with knee extension with PF , knee flexion with DF x 10 Supine bridge 2-3 sec hold 2 x 10  Nustep Lvl 5 LE only 6 mins    Manual: Percussive device to Rt glute max, piriformis as well as Rt lateral and medial calf.   Trigger Point Dry Needling  Initial Treatment: Pt instructed on Dry Needling rational, procedures, and possible side effects.  Patient Verbal Consent Given: Yes Education Handout Provided: Yes Muscles Treated: Rt glute max, Rt lateral gastroc  Treatment Response/Outcome: local twitch response Self care education performed with DN.   Self Care Education verbally on post manual and needling soreness possibility and effective strategy to help address and improve following visit.  Strategies included but not limited to:  heat/ice prn, increased water intake, use of HEP and general mobility to move muscle soreness out.  Pt voiced understanding.    04/19/2024 TherEx:  Nustep seat 5, level 5 for 6 minutes  Discussed HEP to include what to do following session and when to continue with full program   Neuro Re-Ed:  Single leg stance on foam and performing square taps 2x10  Intermittent UE use throughout secondary to minimal lateral LOB  Single leg stance with slider under one foot performing fwd, lateral, back slides 2x5 each leg  Verbal cues for lateral moment in Rt hip with Lt slide, moderate carryover  Balance board fwd/back 2x10 with focus on control of movement   Manual: IASTM with percussion device to right gastroc and  hamstring  Discussed use of dry needling at next appointment  PATIENT EDUCATION:  Education details: exam findings, POC, HEP  Person educated: Patient Education method: Explanation, Demonstration, and Handouts Education comprehension: verbalized understanding, returned demonstration, and needs further education  HOME EXERCISE PROGRAM: Access Code: [email or web address] URL: [email or web address] Date: 05/08/2024 Prepared by: Corean Ku  Exercises - Supine Bridge with Resistance Band  - 2 x daily - 7 x weekly - 1 sets - 10 reps - 2 seconds  hold - Supine Posterior Pelvic Tilt  - 2 x daily - 7 x weekly - 1 sets - 10 reps - 3-5 seconds  hold - Supine Lower Trunk Rotation  - 2 x daily - 7 x weekly - 1 sets - 10 reps - 3-5 seconds  hold - Supine March with Posterior Pelvic Tilt  - 1 x daily - 7 x weekly - 2 sets - 10 reps - Small Range SLR + Posterior Pelvic Tilt   - 1 x daily - 7 x weekly - 2 sets - 10 reps - Standing Transverse Abdominis Contraction  - 3 x daily - 7 x weekly - 10 reps - 3-5 seconds  hold - Seated Slump Nerve Glide  - 1 x daily - 7 x weekly - 3 sets - 10 reps - Figure 4 Bridge  - 1 x daily - 7 x weekly - 1-2 sets - 10 reps - 5 sec hold - Sidelying Hip Circles  - 1 x daily - 7 x weekly - 2 sets - 10 circles each way  ASSESSMENT:  CLINICAL IMPRESSION: Pt has attended 9 PT visits at this time and has demonstrated good progress with PT.  She met her PSFS goal at this time, so revised today.  All other goals are ongoing at this time.    OBJECTIVE IMPAIRMENTS: Abnormal gait, decreased mobility, difficulty walking, decreased strength, increased fascial restrictions, increased muscle spasms, impaired flexibility, postural dysfunction, and pain.   ACTIVITY LIMITATIONS: carrying, lifting, bending, sitting, standing, squatting, sleeping, stairs, transfers, and locomotion level  PARTICIPATION LIMITATIONS: meal prep, cleaning, laundry, shopping, community activity,  occupation, and yard work  PERSONAL FACTORS: Education, Financial risk analyst, Past/current experiences, Profession, Social background, and Time since onset of injury/illness/exacerbation are also affecting patient's functional outcome.   REHAB POTENTIAL: Good  CLINICAL DECISION MAKING: Stable/uncomplicated  EVALUATION COMPLEXITY: Low   GOALS: Goals reviewed with patient? No  SHORT TERM GOALS: Target date: 04/05/2024      Will be compliant with appropriate progressive HEP GOAL STATUS: met 03/29/24   2. Will demonstrate improved postural awareness with all functional tasks, use of ergonomic aides PRN/as desired GOAL STATUS: Met   3. Will demonstrate good functional biomechanics for bed mobility and floor to waist lifting mechanics  GOAL STATUS: Met  4. Lumbar AROM to be full and without feelings of stiffness or increased pain GOAL STATUS: partially met    LONG TERM GOALS: Target date: 06/05/2024    MMT to have improved by one grade all weak groups GOAL STATUS: ONGOING 05/08/24  2. Mm flexibility and spasms to have improved by at least 50% in order to improve functional movement patterns and for pain control GOAL STATUS: MET 05/08/24  3. Pain to be no more than 1/10 with all functional activities including exercise as desired  GOAL STATUS: ONGOING 05/08/24   4. Will have improved score on PSFS to > 8 to show improved QOL and subjective perception of condition  GOAL STATUS: MET; REVISED 05/08/24    PLAN:  PT FREQUENCY: 1x/week  PT DURATION: 8 weeks  PLANNED INTERVENTIONS: 97750-  Physical Performance Testing, 97110-Therapeutic exercises, 97530- Therapeutic activity, [id number]- Neuromuscular re-education, 97535- Self Care, [id number]- Manual therapy, [id number]- Aquatic Therapy, and 20560 (1-2 muscles), 20561 (3+ muscles)- Dry Needling.  PLAN FOR NEXT SESSION:   Check up on DN response; work on hip strengthening   Corean JULIANNA Ku, PT, DPT 05/08/24 12:43 PM

## 2024-05-15 ENCOUNTER — Encounter: Admitting: Internal Medicine

## 2024-05-17 ENCOUNTER — Ambulatory Visit: Admitting: Physical Therapy

## 2024-05-17 ENCOUNTER — Encounter: Payer: Self-pay | Admitting: Physical Therapy

## 2024-05-17 DIAGNOSIS — M5459 Other low back pain: Secondary | ICD-10-CM | POA: Diagnosis not present

## 2024-05-17 DIAGNOSIS — M79661 Pain in right lower leg: Secondary | ICD-10-CM

## 2024-05-17 DIAGNOSIS — R293 Abnormal posture: Secondary | ICD-10-CM | POA: Diagnosis not present

## 2024-05-17 DIAGNOSIS — M25571 Pain in right ankle and joints of right foot: Secondary | ICD-10-CM

## 2024-05-17 DIAGNOSIS — R29898 Other symptoms and signs involving the musculoskeletal system: Secondary | ICD-10-CM

## 2024-05-17 DIAGNOSIS — M6281 Muscle weakness (generalized): Secondary | ICD-10-CM

## 2024-05-17 DIAGNOSIS — R262 Difficulty in walking, not elsewhere classified: Secondary | ICD-10-CM

## 2024-05-17 NOTE — Therapy (Signed)
 OUTPATIENT PHYSICAL THERAPY TREATMENT   Patient Name: Michele Andersen MRN: [id number] DOB:Oct 08, 1975, 48 y.o., female Today's Date: 05/17/2024  END OF SESSION:  PT End of Session - 05/17/24 1100     Visit Number 10    Number of Visits 12    Date for Recertification  06/05/24    Authorization Type [employer]    Authorization - Number of Visits 20    PT Start Time 1100    PT Stop Time 1139    PT Time Calculation (min) 39 min    Activity Tolerance Patient tolerated treatment well    Behavior During Therapy WFL for tasks assessed/performed               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   DILITATION & CURRETTAGE/HYSTROSCOPY WITH HYDROTHERMAL ABLATION N/A 07/21/2022   Procedure: DILATATION & CURETTAGE/HYSTEROSCOPY WITH HYDROTHERMAL ABLATION;  Surgeon: Barbette Knock, MD;  Location: [facility];  Service: Gynecology;  Laterality: N/A;   ELBOW SURGERY Right 12/07/2015   @[facility]   by dr d. sebastian;    RIGHT EPICONDYLAR DEBRIDEMENT AND RADIAL NEUROPLASTY   WRIST GANGLION EXCISION Right 12/13/2019    @[facility]    by   dr d. 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   Allergic rhinitis 08/12/2010   DEPRESSION 07/27/2007   EUSTACHIAN TUBE DYSFUNCTION 07/27/2007   SINUSITIS- ACUTE-NOS 07/27/2007   ECZEMA 07/27/2007    PCP: Charlett Howard MD   REFERRING PROVIDER: Joane Artist RAMAN, MD  REFERRING DIAG: Diagnosis M54.41,G89.29 (ICD-10-CM) - Chronic low back pain with right-sided sciatica, unspecified back pain laterality  Rationale for Evaluation and Treatment: Rehabilitation  THERAPY DIAG:  Other low back pain  Pain in right lower leg  Abnormal posture  Other symptoms and signs involving the musculoskeletal system  Muscle weakness (generalized)  Pain in right ankle and joints of right foot  Difficulty in walking, not elsewhere classified  ONSET DATE: chronic   SUBJECTIVE:  SUBJECTIVE STATEMENT: Rt side is feeling better, Lt side is still uncomfortable.    PERTINENT HISTORY:  Eval: I've had problems for years in my lower back, usually I can work it out myself. See a chiropractor monthly for maintenance. Clemens down steps in January and broke my arm, wasn't able to stretch regularly. Having pain in my calf but it seems separate from sciatica pain. Chiropractor  told me calf is very tight too. I've done stretches in my own home, tried foam rollers, tennis balls, TENS, ice, heat, voltaren, chiropractic adjustments, traction.   PAIN:  NPRS scale: 2 currently, 4/10 prior to taking tylenol  Pain location: low back  Pain description: tight, squeezing, small amounts of nerve pain  earlier but not now  Aggravating factors:  sitting for >30 minutes, cleaning  Relieving factors:  laying flat, decompressing back   PRECAUTIONS: None  RED FLAGS: None   WEIGHT BEARING RESTRICTIONS: No  FALLS:  Has patient fallen in last 6 months? 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 Morene Mace is taking over her care during this period)  OBJECTIVE:  Note: Objective measures were completed at Evaluation unless otherwise noted.  DIAGNOSTIC FINDINGS:   CLINICAL DATA:  Low back pain for years.   EXAM: LUMBAR SPINE - 2-3 VIEW   COMPARISON:  None Available.   FINDINGS: There is no evidence of lumbar spine fracture. Minimal curvature of spine. Minimal anterior spurring noted at L3 and L4. Intervertebral disc spaces are maintained.   IMPRESSION: Minimal degenerative joint changes of lumbar spine.  PATIENT SURVEYS:  PSFS: THE PATIENT SPECIFIC FUNCTIONAL SCALE  Place score of 0-10 (0 = unable to perform activity and 10 = able to perform activity at the same level as before injury or problem)  Activity Date: 03/08/24  04/24/2024 05/08/24  Sitting over 30 minutes  5 6 5   2. Walking over 20 minutes  5 5 5   3.Turning head to the right when bending  4 10 9   4. Twisting  5 9 10   Total Score 4.75 7.5 avg 7.25    Total Score = Sum of activity scores/number of activities  Minimally Detectable Change: 3 points (for single activity); 2 points (for average score)   COGNITION: Overall cognitive status: Within functional limits for tasks  assessed     SENSATION: Eval Some reports of very short zips of electricity from lateral calf down, rarely happens from hip/back down   MUSCLE LENGTH: Eval Quads OK, hip flexors OK, HS and piriformis OK B, SIJ in alignment per basic screen   POSTURE:  Eval rounded shoulders, forward head, decreased lumbar lordosis, and increased thoracic kyphosis  PALPATION: Eval Deep mm spasms noted lateral calf R LE   LUMBAR ROM:   AROM eval 03/29/24  Flexion Full ROM   Extension Full ROM but stiff feeling to her    Right lateral flexion WNL  WNL   Left lateral flexion WNL  WNL   Right rotation 50% limited  25% limited   Left rotation 50% limited  25% limited    (Blank rows = not tested)  LOWER EXTREMITY MMT:    MMT Right eval Left eval Right 03/29/24 Left 03/29/24 Right/Left 05/08/24  Hip flexion 4+ 4+ 5 5   Hip extension 3- 3 4- 4- 4/4  Hip abduction 4+ 3+ 4+ 4- 4/4  Hip adduction       Hip internal rotation       Hip external rotation       Knee flexion 4 4 5 5    Knee extension 4+ 4+ 5 5   Ankle dorsiflexion       Ankle plantarflexion       Ankle inversion       Ankle eversion        (Blank rows = not tested)  LUMBAR SPECIAL TESTS:  Eval:  SIJ in alignment, slump test (-) but does seem to have some mm tightness                     TREATMENT 05/17/24 Prone on elbows x 2 min Prone press up to elbows 10x10 sec hold Cat/cow 10x5 sec Prone hip ext alternating 3 sec hold x 10 reps bil Isometric hip abd with strap in hooklying 10 x 5 sec hold Isometric hip ext in supine and 90 deg hip flexion x 10 reps bil; 5 sec hold Bridges with strap x 10 reps Standing good mornings x 10 reps; adding 10# KB 2 x 10 reps     05/08/24 Manual: STM with compression to glutes; piriformis; Rt gastroc; skilled palpation and monitoring of soft tissue during DN.  Trigger Point Dry Needling  Subsequent Treatment: Instructions provided previously at initial dry needling treatment.    Patient Verbal Consent Given: Yes Education Handout Provided: Previously Provided Muscles Treated: Bil glute med, bil piriformis, Rt gastroc Electrical Stimulation Performed: Yes, Parameters: bil glutes/piriformis 10 mA freq with intensity to tolerance x 3-5 min each Treatment Response/Outcome: twitch responses noted  Goal assessment and update; discussed/reviewed with pt Figure 4 glute bridge x 3 reps bil for HEP Sidelying hip circles x 5 reps CW/CCW bil for HEP        93/2025 Therex: Supine figure 4 pull towards stretch 30 sec x 3 Rt leg  Supine Rt leg [facility] education review Supine Rt leg sciatic nerve flossing with knee extension with PF , knee flexion with DF x 10 Supine bridge 2-3 sec hold 2 x 10  Nustep Lvl 5 LE only 6 mins    Manual: Percussive device to Rt glute max, piriformis as well as Rt lateral and medial calf.   Trigger Point Dry Needling  Initial Treatment: Pt instructed on Dry Needling rational, procedures, and possible side effects.  Patient Verbal Consent Given: Yes Education Handout Provided: Yes Muscles Treated: Rt glute max, Rt lateral gastroc  Treatment Response/Outcome: local twitch response Self care education performed with DN.   Self Care Education verbally on post manual and needling soreness possibility and effective strategy to help address and improve following visit.  Strategies included but not limited to:  heat/ice prn, increased water intake, use of HEP and general mobility to move muscle soreness out.  Pt voiced understanding.    PATIENT EDUCATION:  Education details: exam findings, POC, HEP  Person educated: Patient Education method: Explanation, Demonstration, and Handouts Education comprehension: verbalized understanding, returned demonstration, and needs further education  HOME EXERCISE PROGRAM: Access Code: [email or web address] URL: [email or web address] Date: 05/17/2024 Prepared by: Corean Ku  Exercises -  Supine Bridge with Resistance Band  - 2 x daily - 7 x weekly -  1 sets - 10 reps - 2 seconds  hold - Supine Posterior Pelvic Tilt  - 2 x daily - 7 x weekly - 1 sets - 10 reps - 3-5 seconds  hold - Supine Lower Trunk Rotation  - 2 x daily - 7 x weekly - 1 sets - 10 reps - 3-5 seconds  hold - Supine March with Posterior Pelvic Tilt  - 1 x daily - 7 x weekly - 2 sets - 10 reps - Small Range SLR + Posterior Pelvic Tilt   - 1 x daily - 7 x weekly - 2 sets - 10 reps - Standing Transverse Abdominis Contraction  - 3 x daily - 7 x weekly - 10 reps - 3-5 seconds  hold - Seated Slump Nerve Glide  - 1 x daily - 7 x weekly - 3 sets - 10 reps - Figure 4 Bridge  - 1 x daily - 7 x weekly - 1-2 sets - 10 reps - 5 sec hold - Sidelying Hip Circles  - 1 x daily - 7 x weekly - 2 sets - 10 circles each way - Prone Hip Extension  - 1 x daily - 7 x weekly - 1 sets - 10 reps - 3-5 sec hold - Supine Hip Extension Isometric in 90 Hip Flexion  - 1 x daily - 7 x weekly - 1 sets - 10 reps - 5 sec hold - Hooklying Isometric Hip Abduction with Belt  - 1 x daily - 7 x weekly - 1 sets - 10 reps - 5 sec hold - Bridge with Resistance  - 2 x daily - 7 x weekly - 1 sets - 15 reps - 5 sec hold  ASSESSMENT:  CLINICAL IMPRESSION: Session today focused on SI stabilization exercises and strengthening exercises.  Discussed standing yoga and piliates exercises as an option as well.  Continue skilled PT at this time.    OBJECTIVE IMPAIRMENTS: Abnormal gait, decreased mobility, difficulty walking, decreased strength, increased fascial restrictions, increased muscle spasms, impaired flexibility, postural dysfunction, and pain.   ACTIVITY LIMITATIONS: carrying, lifting, bending, sitting, standing, squatting, sleeping, stairs, transfers, and locomotion level  PARTICIPATION LIMITATIONS: meal prep, cleaning, laundry, shopping, community activity, occupation, and yard work  PERSONAL FACTORS: Education, Financial risk analyst, Past/current experiences,  Profession, Social background, and Time since onset of injury/illness/exacerbation are also affecting patient's functional outcome.   REHAB POTENTIAL: Good  CLINICAL DECISION MAKING: Stable/uncomplicated  EVALUATION COMPLEXITY: Low   GOALS: Goals reviewed with patient? No  SHORT TERM GOALS: Target date: 04/05/2024      Will be compliant with appropriate progressive HEP GOAL STATUS: met 03/29/24   2. Will demonstrate improved postural awareness with all functional tasks, use of ergonomic aides PRN/as desired GOAL STATUS: Met   3. Will demonstrate good functional biomechanics for bed mobility and floor to waist lifting mechanics  GOAL STATUS: Met  4. Lumbar AROM to be full and without feelings of stiffness or increased pain GOAL STATUS: partially met    LONG TERM GOALS: Target date: 06/05/2024    MMT to have improved by one grade all weak groups GOAL STATUS: ONGOING 05/08/24  2. Mm flexibility and spasms to have improved by at least 50% in order to improve functional movement patterns and for pain control GOAL STATUS: MET 05/08/24  3. Pain to be no more than 1/10 with all functional activities including exercise as desired  GOAL STATUS: ONGOING 05/08/24   4. Will have improved score on PSFS to > 8 to show  improved QOL and subjective perception of condition  GOAL STATUS: MET; REVISED 05/08/24    PLAN:  PT FREQUENCY: 1x/week  PT DURATION: 8 weeks  PLANNED INTERVENTIONS: 97750- Physical Performance Testing, 97110-Therapeutic exercises, 97530- Therapeutic activity, [id number]- Neuromuscular re-education, 97535- Self Care, [id number]- Manual therapy, [id number]- Aquatic Therapy, and 20560 (1-2 muscles), 20561 (3+ muscles)- Dry Needling.  PLAN FOR NEXT SESSION:  how are new exercises going?,   Check up on DN response; work on hip strengthening   Corean JULIANNA Ku, PT, DPT 05/17/24 11:57 AM

## 2024-05-23 ENCOUNTER — Encounter: Payer: Self-pay | Admitting: Rehabilitative and Restorative Service Providers"

## 2024-05-23 ENCOUNTER — Ambulatory Visit: Admitting: Rehabilitative and Restorative Service Providers"

## 2024-05-23 ENCOUNTER — Other Ambulatory Visit: Payer: Self-pay

## 2024-05-23 DIAGNOSIS — R293 Abnormal posture: Secondary | ICD-10-CM | POA: Diagnosis not present

## 2024-05-23 DIAGNOSIS — R29898 Other symptoms and signs involving the musculoskeletal system: Secondary | ICD-10-CM

## 2024-05-23 DIAGNOSIS — M6281 Muscle weakness (generalized): Secondary | ICD-10-CM

## 2024-05-23 DIAGNOSIS — M5459 Other low back pain: Secondary | ICD-10-CM

## 2024-05-23 DIAGNOSIS — M79661 Pain in right lower leg: Secondary | ICD-10-CM

## 2024-05-23 MED ORDER — HYDROXYZINE PAMOATE 25 MG PO CAPS: ORAL_CAPSULE | ORAL | 0 refills | Status: AC

## 2024-05-23 NOTE — Therapy (Signed)
 OUTPATIENT PHYSICAL THERAPY TREATMENT   Patient Name: Michele Andersen MRN: [id number] DOB:[birth year], 48 y.o., female Today's Date: 05/23/2024  END OF SESSION:  PT End of Session - 05/23/24 1611     Visit Number 11    Number of Visits 12    Date for Recertification  06/05/24    Authorization Type [employer]    Authorization - Number of Visits 20    PT Start Time 1607    PT Stop Time 1645    PT Time Calculation (min) 38 min    Activity Tolerance Patient tolerated treatment well    Behavior During Therapy WFL for tasks assessed/performed                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   DILITATION & CURRETTAGE/HYSTROSCOPY WITH HYDROTHERMAL ABLATION N/A 07/21/2022   Procedure: DILATATION & CURETTAGE/HYSTEROSCOPY WITH HYDROTHERMAL ABLATION;  Surgeon: Barbette Knock, MD;  Location: [facility];  Service: Gynecology;  Laterality: N/A;   ELBOW SURGERY Right 12/07/2015   @[facility]   by dr d. sebastian;    RIGHT EPICONDYLAR DEBRIDEMENT AND RADIAL NEUROPLASTY   WRIST GANGLION EXCISION Right 12/13/2019    @[facility]    by   dr d. 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   Allergic rhinitis 08/12/2010   DEPRESSION 07/27/2007   EUSTACHIAN TUBE DYSFUNCTION 07/27/2007   SINUSITIS- ACUTE-NOS 07/27/2007   ECZEMA 07/27/2007    PCP: Charlett Howard MD   REFERRING PROVIDER: Joane Artist RAMAN, MD  REFERRING DIAG: Diagnosis M54.41,G89.29 (ICD-10-CM) - Chronic low back pain with right-sided sciatica, unspecified back pain laterality  Rationale for Evaluation and Treatment: Rehabilitation  THERAPY DIAG:  Other low back pain  Pain in right lower leg  Abnormal posture  Other symptoms and signs involving the musculoskeletal system  Muscle weakness (generalized)  ONSET DATE: chronic   SUBJECTIVE:  SUBJECTIVE STATEMENT: Pt indicated feeling like getting some improvement.  Reported feeling some on Lt and Rt side.  Reported variable symptoms.  Reported the last few days more on the Lt posterior/lateral hip.    PERTINENT HISTORY:  Eval: I've had problems for years in my lower back, usually I can work it out myself. See a chiropractor monthly for maintenance. Clemens down steps in January and broke my arm, wasn't able to stretch regularly. Having pain in my calf but it seems separate from sciatica pain. Chiropractor  told me calf is very tight too. I've done stretches in my own home, tried foam rollers, tennis balls, TENS, ice, heat, voltaren, chiropractic adjustments, traction.   PAIN:  NPRS scale: upon arrival 4/10 Pain location: Lt buttock.  Pain description: tight, squeezing, small amounts of nerve  pain earlier but not now  Aggravating factors:  sitting for >30 minutes, cleaning  Relieving factors:  laying flat, decompressing back   PRECAUTIONS: None  RED FLAGS: None   WEIGHT BEARING RESTRICTIONS: No  FALLS:  Has patient fallen in last 6 months? 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 Morene Mace is taking over her care during this period)  OBJECTIVE:  Note: Objective measures were completed at Evaluation unless otherwise noted.  DIAGNOSTIC FINDINGS:   CLINICAL DATA:  Low back pain for years.   EXAM: LUMBAR SPINE - 2-3 VIEW   COMPARISON:  None Available.   FINDINGS: There is no evidence of lumbar spine fracture. Minimal curvature of spine. Minimal anterior spurring noted at L3 and L4. Intervertebral disc spaces are maintained.   IMPRESSION: Minimal degenerative joint changes of lumbar spine.  PATIENT SURVEYS:  PSFS: THE PATIENT SPECIFIC FUNCTIONAL SCALE  Place score of 0-10 (0 = unable to perform activity and 10 = able to perform activity at the same level as before injury or problem)  Activity Date: 03/08/24  04/24/2024 05/08/24  Sitting over 30 minutes  5 6 5   2. Walking over 20 minutes  5 5 5   3.Turning head to the right when bending  4 10 9   4. Twisting  5 9 10   Total Score 4.75 7.5 avg 7.25    Total Score = Sum of activity scores/number of activities  Minimally Detectable Change: 3 points (for single activity); 2 points (for average score)   COGNITION: Overall cognitive status: Within functional limits for tasks  assessed     SENSATION: Eval Some reports of very short zips of electricity from lateral calf down, rarely happens from hip/back down   MUSCLE LENGTH: Eval Quads OK, hip flexors OK, HS and piriformis OK B, SIJ in alignment per basic screen   POSTURE:  Eval rounded shoulders, forward head, decreased lumbar lordosis, and increased thoracic kyphosis  PALPATION: Eval Deep mm spasms noted lateral calf R LE   LUMBAR ROM:   AROM eval 03/29/24 05/23/2024  Flexion Full ROM    Extension Full ROM but stiff feeling to her   100% WFL s symptoms.   Right lateral flexion WNL  WNL    Left lateral flexion WNL  WNL  Right rotation 50% limited  25% limited    Left rotation 50% limited  25% limited     (Blank rows = not tested)    LOWER EXTREMITY MMT:    MMT Right eval Left eval Right 03/29/24 Left 03/29/24 Right/Left 05/08/24 Right 05/23/2024 Left 05/23/2024   Hip flexion 4+ 4+ 5 5     Hip extension 3- 3 4- 4- 4/4 5/5 5/5  Hip abduction 4+ 3+ 4+ 4- 4/4    Hip adduction         Hip internal rotation         Hip external rotation         Knee flexion 4 4 5 5      Knee extension 4+ 4+ 5 5     Ankle dorsiflexion         Ankle plantarflexion         Ankle inversion         Ankle eversion          (Blank rows = not tested)  LUMBAR SPECIAL TESTS:  Eval:  SIJ in alignment, slump test (-) but does seem to have some mm tightness                     TREATMENT      DATE: 05/23/2024 Therex: Prone opposite arm/leg lift x 15 bilateral  Prone press up x 10  Supine sciatic nerve flossing (DF c knee flexion, PF c knee extension) x 15 bilateral  Supine bridge c grey band hip abduction hold x 15, with yellow ball hip add squeeze x 15  Standing good mornings 10 lb kettle bell 2 x 10  Standing hip hike 3 sec hold x 10 bilateral  Standing lumbar extension AROM x 5   Manual: Percussive device Lt and Rt glute max, deep hip rotators posteriorly       TREATMENT      DATE: 05/17/24 Prone on  elbows x 2 min Prone press up to elbows 10x10 sec hold Cat/cow 10x5 sec Prone hip ext alternating 3 sec hold x 10 reps bil Isometric hip abd with strap in hooklying 10 x 5 sec hold Isometric hip ext in supine and 90 deg hip flexion x 10 reps bil; 5 sec hold Bridges with strap x 10 reps Standing good mornings x 10 reps; adding 10# KB 2 x 10 reps     TREATMENT      DATE: 05/08/24 Manual: STM with compression to glutes; piriformis; Rt gastroc; skilled palpation and monitoring of soft tissue during DN.  Trigger Point Dry Needling  Subsequent Treatment: Instructions provided previously at initial dry needling treatment.   Patient Verbal Consent Given: Yes Education Handout Provided: Previously Provided Muscles Treated: Bil glute med, bil piriformis, Rt gastroc Electrical Stimulation Performed: Yes, Parameters: bil glutes/piriformis 10 mA freq with intensity to tolerance x 3-5 min each Treatment Response/Outcome: twitch responses noted  Goal assessment and update; discussed/reviewed with pt Figure 4 glute bridge x 3 reps bil for HEP Sidelying hip circles x 5 reps CW/CCW bil for HEP    PATIENT EDUCATION:  Education details: exam findings, POC, HEP  Person educated: Patient Education method: Programmer, multimedia, Demonstration, and Handouts Education comprehension: verbalized understanding, returned demonstration, and needs further education  HOME EXERCISE PROGRAM: Access Code: [email or web address] URL: [email or web address] Date: 05/17/2024 Prepared by: Corean Ku  Exercises - Supine Bridge with Resistance Band  - 2 x daily - 7 x weekly - 1 sets - 10 reps -  2 seconds  hold - Supine Posterior Pelvic Tilt  - 2 x daily - 7 x weekly - 1 sets - 10 reps - 3-5 seconds  hold - Supine Lower Trunk Rotation  - 2 x daily - 7 x weekly - 1 sets - 10 reps - 3-5 seconds  hold - Supine March with Posterior Pelvic Tilt  - 1 x daily - 7 x weekly - 2 sets - 10 reps - Small Range SLR + Posterior  Pelvic Tilt   - 1 x daily - 7 x weekly - 2 sets - 10 reps - Standing Transverse Abdominis Contraction  - 3 x daily - 7 x weekly - 10 reps - 3-5 seconds  hold - Seated Slump Nerve Glide  - 1 x daily - 7 x weekly - 3 sets - 10 reps - Figure 4 Bridge  - 1 x daily - 7 x weekly - 1-2 sets - 10 reps - 5 sec hold - Sidelying Hip Circles  - 1 x daily - 7 x weekly - 2 sets - 10 circles each way - Prone Hip Extension  - 1 x daily - 7 x weekly - 1 sets - 10 reps - 3-5 sec hold - Supine Hip Extension Isometric in 90 Hip Flexion  - 1 x daily - 7 x weekly - 1 sets - 10 reps - 5 sec hold - Hooklying Isometric Hip Abduction with Belt  - 1 x daily - 7 x weekly - 1 sets - 10 reps - 5 sec hold - Bridge with Resistance  - 2 x daily - 7 x weekly - 1 sets - 15 reps - 5 sec hold  ASSESSMENT:  CLINICAL IMPRESSION: Pt elected percussive device treatment vs. Needling today due to overall progress noted. Discussed importance of taking breaks in [place].  Hip strength testing showed improvement today.    OBJECTIVE IMPAIRMENTS: Abnormal gait, decreased mobility, difficulty walking, decreased strength, increased fascial restrictions, increased muscle spasms, impaired flexibility, postural dysfunction, and pain.   ACTIVITY LIMITATIONS: carrying, lifting, bending, sitting, standing, squatting, sleeping, stairs, transfers, and locomotion level  PARTICIPATION LIMITATIONS: meal prep, cleaning, laundry, shopping, community activity, occupation, and yard work  PERSONAL FACTORS: Education, Financial risk analyst, Past/current experiences, Profession, Social background, and Time since onset of injury/illness/exacerbation are also affecting patient's functional outcome.   REHAB POTENTIAL: Good  CLINICAL DECISION MAKING: Stable/uncomplicated  EVALUATION COMPLEXITY: Low   GOALS: Goals reviewed with patient? No  SHORT TERM GOALS: Target date: 04/05/2024      Will be compliant with appropriate progressive HEP GOAL  STATUS: met 03/29/24   2. Will demonstrate improved postural awareness with all functional tasks, use of ergonomic aides PRN/as desired GOAL STATUS: Met   3. Will demonstrate good functional biomechanics for bed mobility and floor to waist lifting mechanics  GOAL STATUS: Met  4. Lumbar AROM to be full and without feelings of stiffness or increased pain GOAL STATUS: partially met    LONG TERM GOALS: Target date: 06/05/2024    MMT to have improved by one grade all weak groups GOAL STATUS: ONGOING 05/08/24  2. Mm flexibility and spasms to have improved by at least 50% in order to improve functional movement patterns and for pain control GOAL STATUS: MET 05/08/24  3. Pain to be no more than 1/10 with all functional activities including exercise as desired  GOAL STATUS: ONGOING 05/08/24   4. Will have improved score on PSFS to > 8 to show improved QOL and  subjective perception of condition  GOAL STATUS: MET; REVISED 05/08/24    PLAN:  PT FREQUENCY: 1x/week  PT DURATION: 8 weeks  PLANNED INTERVENTIONS: 97750- Physical Performance Testing, 97110-Therapeutic exercises, 97530- Therapeutic activity, [id number]- Neuromuscular re-education, 97535- Self Care, [id number]- Manual therapy, [id number]- Aquatic Therapy, and 20560 (1-2 muscles), 20561 (3+ muscles)- Dry Needling.  PLAN FOR NEXT SESSION:  DN as desired.     Ozell Silvan, PT, DPT, OCS, ATC 05/23/24  4:42 PM

## 2024-05-31 ENCOUNTER — Encounter: Payer: Self-pay | Admitting: Physical Therapy

## 2024-05-31 ENCOUNTER — Ambulatory Visit: Admitting: Physical Therapy

## 2024-05-31 DIAGNOSIS — M79661 Pain in right lower leg: Secondary | ICD-10-CM

## 2024-05-31 DIAGNOSIS — M5459 Other low back pain: Secondary | ICD-10-CM

## 2024-05-31 DIAGNOSIS — R29898 Other symptoms and signs involving the musculoskeletal system: Secondary | ICD-10-CM | POA: Diagnosis not present

## 2024-05-31 DIAGNOSIS — R293 Abnormal posture: Secondary | ICD-10-CM | POA: Diagnosis not present

## 2024-05-31 DIAGNOSIS — M6281 Muscle weakness (generalized): Secondary | ICD-10-CM

## 2024-05-31 NOTE — Therapy (Signed)
 OUTPATIENT PHYSICAL THERAPY TREATMENT DISCHARGE SUMMARY   Patient Name: Michele Andersen MRN: [id number] DOB:04-05-1976, 48 y.o., female Today's Date: 05/31/2024  END OF SESSION:  PT End of Session - 05/31/24 1102     Visit Number 12    Number of Visits 12    Date for Recertification  06/05/24    Authorization Type [employer]    Authorization - Number of Visits 20    PT Start Time 1102    PT Stop Time 1130    PT Time Calculation (min) 28 min    Activity Tolerance Patient tolerated treatment well    Behavior During Therapy WFL for tasks assessed/performed                 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   DILITATION & CURRETTAGE/HYSTROSCOPY WITH HYDROTHERMAL ABLATION N/A 07/21/2022   Procedure: DILATATION & CURETTAGE/HYSTEROSCOPY WITH HYDROTHERMAL ABLATION;  Surgeon: Barbette Knock, MD;  Location: [facility];  Service: Gynecology;  Laterality: N/A;   ELBOW SURGERY Right 12/07/2015   @[facility]   by dr d. sebastian;    RIGHT EPICONDYLAR DEBRIDEMENT AND RADIAL NEUROPLASTY   WRIST GANGLION EXCISION  Right 12/13/2019   @[facility]    by   dr d. 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   Allergic rhinitis 08/12/2010   DEPRESSION 07/27/2007   EUSTACHIAN TUBE DYSFUNCTION 07/27/2007   SINUSITIS- ACUTE-NOS 07/27/2007   ECZEMA 07/27/2007    PCP: Charlett Howard MD   REFERRING PROVIDER: Joane Artist RAMAN, MD  REFERRING DIAG: Diagnosis M54.41,G89.29 (ICD-10-CM) - Chronic low back pain with right-sided sciatica, unspecified back pain laterality  Rationale for Evaluation and Treatment: Rehabilitation  THERAPY DIAG:  Other low back pain  Pain in right lower leg  Abnormal posture  Other symptoms and signs involving the musculoskeletal system  Muscle weakness (generalized)  ONSET DATE: chronic   SUBJECTIVE:  SUBJECTIVE STATEMENT: Feels overall to be doing pretty well; pain is around a 3/10 most of the time.   PERTINENT HISTORY:  Eval: I've had problems for years in my lower back, usually I can work it out myself. See a chiropractor monthly for maintenance. Clemens down steps in January and broke my arm, wasn't able to stretch regularly. Having pain in my calf but it seems separate from sciatica pain. Chiropractor  told me calf is very tight too. I've done stretches in my own home, tried foam rollers, tennis balls, TENS, ice, heat, voltaren, chiropractic adjustments, traction.   PAIN:  NPRS scale: upon arrival 4/10 Pain location: Lt buttock.  Pain description: tight, squeezing, small amounts of nerve pain earlier but not now  Aggravating factors:  sitting for >30 minutes, cleaning   Relieving factors:  laying flat, decompressing back   PRECAUTIONS: None  RED FLAGS: None   WEIGHT BEARING RESTRICTIONS: No  FALLS:  Has patient fallen in last 6 months? 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 Morene Mace is taking over her care during this period)  OBJECTIVE:  Note: Objective measures were completed at Evaluation unless otherwise noted.  DIAGNOSTIC FINDINGS:   CLINICAL DATA:  Low back pain for years.   EXAM: LUMBAR SPINE - 2-3 VIEW   COMPARISON:  None Available.   FINDINGS: There is no evidence of lumbar spine fracture. Minimal curvature of spine. Minimal anterior spurring noted at L3 and L4. Intervertebral disc spaces are maintained.   IMPRESSION: Minimal degenerative joint changes of lumbar spine.  PATIENT SURVEYS:  PSFS: THE PATIENT SPECIFIC FUNCTIONAL SCALE  Place score of 0-10 (0 = unable to perform activity and 10 = able to perform activity at the same level as before injury or problem)  Activity Date: 03/08/24  04/24/2024 05/08/24 05/31/24  Sitting over 30 minutes  5 6 5 7   2. Walking over 20 minutes  5 5 5 7   3.Turning head to the right when bending  4 10 9 8   4. Twisting  5 9 10 9   Total Score 4.75 7.5 avg 7.25 7.75 avg    Total Score = Sum of activity scores/number of activities  Minimally Detectable Change: 3 points (for single activity); 2 points (for average score)   COGNITION: Overall cognitive status: Within functional limits for tasks assessed     SENSATION: Eval Some reports of very short zips of  electricity from lateral calf down, rarely happens from hip/back down   MUSCLE LENGTH: Eval Quads OK, hip flexors OK, HS and piriformis OK B, SIJ in alignment per basic screen   POSTURE:  Eval rounded shoulders, forward head, decreased lumbar lordosis, and increased thoracic kyphosis  PALPATION: Eval Deep mm spasms noted lateral calf R LE   LUMBAR ROM:   AROM eval 03/29/24 05/23/2024  Flexion Full ROM    Extension Full ROM but stiff feeling to her   100% WFL s symptoms.   Right lateral flexion WNL  WNL    Left lateral flexion WNL  WNL    Right rotation 50% limited  25% limited  Left rotation 50% limited  25% limited     (Blank rows = not tested)    LOWER EXTREMITY MMT:    MMT Right eval Left eval Right 03/29/24 Left 03/29/24 Right/Left 05/08/24 Right 05/23/2024 Left 05/23/2024  RightLeft 05/31/24  Hip flexion 4+ 4+ 5 5      Hip extension 3- 3 4- 4- 4/4 5/5 5/5   Hip abduction 4+ 3+ 4+ 4- 4/4   4+/4  Hip adduction          Hip internal rotation          Hip external rotation          Knee flexion 4 4 5 5       Knee extension 4+ 4+ 5 5      Ankle dorsiflexion          Ankle plantarflexion          Ankle inversion          Ankle eversion           (Blank rows = not tested)  LUMBAR SPECIAL TESTS:  Eval:  SIJ in alignment, slump test (-) but does seem to have some mm tightness                     TREATMENT 05/31/24 Goal assessment with discussion of progress and goals met - see objective data above Long discussion about current HEP and progressing from HEP to more generalized exercise program - recommended app based options and Piliates options for patient.  Discussed how much of HEP to do each day as well and PTs recommendations for daily activity  Percussive device Lt and Rt glute max, deep hip rotators posteriorly and bil QL    05/23/2024 Therex: Prone opposite arm/leg lift x 15 bilateral  Prone press up x 10  Supine sciatic nerve flossing (DF c knee flexion,  PF c knee extension) x 15 bilateral  Supine bridge c grey band hip abduction hold x 15, with yellow ball hip add squeeze x 15  Standing good mornings 10 lb kettle bell 2 x 10  Standing hip hike 3 sec hold x 10 bilateral  Standing lumbar extension AROM x 5   Manual: Percussive device Lt and Rt glute max, deep hip rotators posteriorly       05/17/24 Prone on elbows x 2 min Prone press up to elbows 10x10 sec hold Cat/cow 10x5 sec Prone hip ext alternating 3 sec hold x 10 reps bil Isometric hip abd with strap in hooklying 10 x 5 sec hold Isometric hip ext in supine and 90 deg hip flexion x 10 reps bil; 5 sec hold Bridges with strap x 10 reps Standing good mornings x 10 reps; adding 10# KB 2 x 10 reps    05/08/24 Manual: STM with compression to glutes; piriformis; Rt gastroc; skilled palpation and monitoring of soft tissue during DN.  Trigger Point Dry Needling  Subsequent Treatment: Instructions provided previously at initial dry needling treatment.   Patient Verbal Consent Given: Yes Education Handout Provided: Previously Provided Muscles Treated: Bil glute med, bil piriformis, Rt gastroc Electrical Stimulation Performed: Yes, Parameters: bil glutes/piriformis 10 mA freq with intensity to tolerance x 3-5 min each Treatment Response/Outcome: twitch responses noted  Goal assessment and update; discussed/reviewed with pt Figure 4 glute bridge x 3 reps bil for HEP Sidelying hip circles x 5 reps CW/CCW bil for HEP    PATIENT EDUCATION:  Education details: exam findings, POC, HEP  Person educated: Patient Education method: Explanation, Demonstration, and Handouts Education comprehension: verbalized understanding, returned demonstration, and needs further education  HOME EXERCISE PROGRAM: Access Code: [email or web address] URL: [email or web address] Date: 05/17/2024 Prepared by: Corean Ku  Exercises - Supine Bridge with Resistance Band  - 2 x daily - 7 x weekly  - 1 sets - 10 reps - 2 seconds  hold - Supine Posterior Pelvic Tilt  - 2 x daily - 7 x weekly - 1 sets - 10 reps - 3-5 seconds  hold - Supine Lower Trunk Rotation  - 2 x daily - 7 x weekly - 1 sets - 10 reps - 3-5 seconds  hold - Supine March with Posterior Pelvic Tilt  - 1 x daily - 7 x weekly - 2 sets - 10 reps - Small Range SLR + Posterior Pelvic Tilt   - 1 x daily - 7 x weekly - 2 sets - 10 reps - Standing Transverse Abdominis Contraction  - 3 x daily - 7 x weekly - 10 reps - 3-5 seconds  hold - Seated Slump Nerve Glide  - 1 x daily - 7 x weekly - 3 sets - 10 reps - Figure 4 Bridge  - 1 x daily - 7 x weekly - 1-2 sets - 10 reps - 5 sec hold - Sidelying Hip Circles  - 1 x daily - 7 x weekly - 2 sets - 10 circles each way - Prone Hip Extension  - 1 x daily - 7 x weekly - 1 sets - 10 reps - 3-5 sec hold - Supine Hip Extension Isometric in 90 Hip Flexion  - 1 x daily - 7 x weekly - 1 sets - 10 reps - 5 sec hold - Hooklying Isometric Hip Abduction with Belt  - 1 x daily - 7 x weekly - 1 sets - 10 reps - 5 sec hold - Bridge with Resistance  - 2 x daily - 7 x weekly - 1 sets - 15 reps - 5 sec hold  ASSESSMENT:  CLINICAL IMPRESSION: Pt has met/partially met all goals at this time.  She still has some pain but is manageable at this time and feel should continue to improve with exercise.  She feels comfortable with current HEP, and discussed return for dry needling if needed (or other clinic if she prefers based on cost).  Will d/c PT today.  OBJECTIVE IMPAIRMENTS: Abnormal gait, decreased mobility, difficulty walking, decreased strength, increased fascial restrictions, increased muscle spasms, impaired flexibility, postural dysfunction, and pain.   ACTIVITY LIMITATIONS: carrying, lifting, bending, sitting, standing, squatting, sleeping, stairs, transfers, and locomotion level  PARTICIPATION LIMITATIONS: meal prep, cleaning, laundry, shopping, community activity, occupation, and yard  work  PERSONAL FACTORS: Education, Financial risk analyst, Past/current experiences, Profession, Social background, and Time since onset of injury/illness/exacerbation are also affecting patient's functional outcome.   REHAB POTENTIAL: Good  CLINICAL DECISION MAKING: Stable/uncomplicated  EVALUATION COMPLEXITY: Low   GOALS: Goals reviewed with patient? No  SHORT TERM GOALS: Target date: 04/05/2024      Will be compliant with appropriate progressive HEP GOAL STATUS: met 03/29/24   2. Will demonstrate improved postural awareness with all functional tasks, use of ergonomic aides PRN/as desired GOAL STATUS: Met   3. Will demonstrate good functional biomechanics for bed mobility and floor to waist lifting mechanics  GOAL STATUS: Met  4. Lumbar AROM to be full and without feelings of stiffness or increased pain GOAL STATUS: partially met    LONG TERM GOALS: Target date: 06/05/2024  MMT to have improved by one grade all weak groups GOAL STATUS: PARTIALLY MET (all but hip abduction) 05/31/24  2. Mm flexibility and spasms to have improved by at least 50% in order to improve functional movement patterns and for pain control GOAL STATUS: MET 05/08/24  3. Pain to be no more than 1/10 with all functional activities including exercise as desired  GOAL STATUS: NOT MET 05/31/24   4. Will have improved score on PSFS to > 8 to show improved QOL and subjective perception of condition  GOAL STATUS: MET; ONGOING 05/31/24   PLAN:  PT FREQUENCY: 1x/week  PT DURATION: 8 weeks  PLANNED INTERVENTIONS: 97750- Physical Performance Testing, 97110-Therapeutic exercises, 97530- Therapeutic activity, [id number]- Neuromuscular re-education, 97535- Self Care, 02859- Manual therapy, [id number]- Aquatic Therapy, and 20560 (1-2 muscles), 20561 (3+ muscles)- Dry Needling.  PLAN FOR NEXT SESSION:  D/C PT today   Corean JULIANNA Ku, PT, DPT 05/31/24 11:57 AM     PHYSICAL THERAPY DISCHARGE SUMMARY  Visits from  Start of Care: 12  Current functional level related to goals / functional outcomes: See above   Remaining deficits: See above   Education / Equipment: HEP, DN  Patient agrees to discharge. Patient goals were partially met. Patient is being discharged due to being pleased with the current functional level.  Corean JULIANNA Ku, PT, DPT 05/31/24 11:57 AM  [facility] [address] [place], [place], [address] Phone: [phone]   Fax:  [phone]

## 2024-06-05 ENCOUNTER — Encounter: Admitting: Internal Medicine

## 2024-06-11 ENCOUNTER — Other Ambulatory Visit (INDEPENDENT_AMBULATORY_CARE_PROVIDER_SITE_OTHER)

## 2024-06-11 ENCOUNTER — Ambulatory Visit: Payer: Self-pay | Admitting: Internal Medicine

## 2024-06-11 DIAGNOSIS — R030 Elevated blood-pressure reading, without diagnosis of hypertension: Secondary | ICD-10-CM

## 2024-06-11 DIAGNOSIS — Z79899 Other long term (current) drug therapy: Secondary | ICD-10-CM

## 2024-06-11 DIAGNOSIS — Z0000 Encounter for general adult medical examination without abnormal findings: Secondary | ICD-10-CM

## 2024-06-11 LAB — BASIC METABOLIC PANEL WITH GFR
BUN: 16 mg/dL (ref 6–23)
CO2: 26 meq/L (ref 19–32)
Calcium: 8.6 mg/dL (ref 8.4–10.5)
Chloride: 104 meq/L (ref 96–112)
Creatinine, Ser: 0.64 mg/dL (ref 0.40–1.20)
GFR: 103.4 mL/min (ref >60.00)
Glucose, Bld: 88 mg/dL (ref 70–99)
Potassium: 3.8 meq/L (ref 3.5–5.1)
Sodium: 138 meq/L (ref 135–145)

## 2024-06-11 LAB — CBC WITH DIFFERENTIAL/PLATELET
Basophils Absolute: 0 K/uL (ref 0.0–0.1)
Basophils Relative: 0.4 % (ref 0.0–3.0)
Eosinophils Absolute: 0.2 K/uL (ref 0.0–0.7)
Eosinophils Relative: 2.1 % (ref 0.0–5.0)
HCT: 40.6 % (ref 36.0–46.0)
Hemoglobin: 13.6 g/dL (ref 12.0–15.0)
Lymphocytes Relative: 25.6 % (ref 12.0–46.0)
Lymphs Abs: 1.9 K/uL (ref 0.7–4.0)
MCHC: 33.6 g/dL (ref 30.0–36.0)
MCV: 88 fl (ref 78.0–100.0)
Monocytes Absolute: 0.6 K/uL (ref 0.1–1.0)
Monocytes Relative: 8.3 % (ref 3.0–12.0)
Neutro Abs: 4.6 K/uL (ref 1.4–7.7)
Neutrophils Relative %: 63.6 % (ref 43.0–77.0)
Platelets: 316 K/uL (ref 150.0–400.0)
RBC: 4.62 Mil/uL (ref 3.87–5.11)
RDW: 13.3 % (ref 11.5–15.5)
WBC: 7.3 K/uL (ref 4.0–10.5)

## 2024-06-11 LAB — LIPID PANEL
Cholesterol: 173 mg/dL (ref 0–200)
HDL: 43.7 mg/dL (ref >39.00)
LDL Cholesterol: 100 mg/dL — ABNORMAL HIGH (ref 0–99)
NonHDL: 129.66
Total CHOL/HDL Ratio: 4
Triglycerides: 150 mg/dL — ABNORMAL HIGH (ref 0.0–149.0)
VLDL: 30 mg/dL (ref 0.0–40.0)

## 2024-06-11 LAB — TSH: TSH: 0.81 u[IU]/mL (ref 0.35–5.50)

## 2024-06-11 LAB — HEPATIC FUNCTION PANEL
ALT: 18 U/L (ref 0–35)
AST: 14 U/L (ref 0–37)
Albumin: 4.4 g/dL (ref 3.5–5.2)
Alkaline Phosphatase: 44 U/L (ref 39–117)
Bilirubin, Direct: 0.1 mg/dL (ref 0.0–0.3)
Total Bilirubin: 0.4 mg/dL (ref 0.2–1.2)
Total Protein: 6.7 g/dL (ref 6.0–8.3)

## 2024-06-11 LAB — HEMOGLOBIN A1C: Hgb A1c MFr Bld: 5.1 % (ref 4.6–6.5)

## 2024-06-11 NOTE — Progress Notes (Signed)
 Will discuss at upcoming visit   Tg borderline otherwise in range

## 2024-06-13 NOTE — Progress Notes (Unsigned)
 [name] [name] [address] [place] [address] Phone: [phone]   Assessment and Plan:     1. Chronic bilateral low back pain with bilateral sciatica (Primary) 2. Right calf pain 3. Piriformis syndrome, right -Chronic with exacerbation, subsequent visit - Recurrent flare of multiple symptoms including continued right lower back pain with new presentation of left lower back pain radiating into the left hip and upper thigh.  Concerning for lumbar DDD leading to lumbar radiculopathy - Recommend further evaluation with lumbar MRI based on x-ray imaging, pain despite >6 weeks of conservative therapy, pain with day-to-day activities, pain >6/10 - Use meloxicam  15 mg daily as needed for breakthrough pain.  Recommend limiting chronic NSAIDs to 1-2 doses per week to prevent long-term side effects. Use Tylenol  500 to 1000 mg tablets 2-3 times a day as needed for day-to-day pain relief.    - Start prednisone  Dosepak - Continue HEP as tolerated    Pertinent previous records reviewed include none   Follow Up: 1 week after MRI to review results and discuss treatment plan.  Could consider epidural CSI   Subjective:   I, Claretha Schimke am a scribe for Dr. Leonce.   Chief Complaint: back pain    HPI:    02/14/2024 Jodeen Mclin is a 48 y.o. female who presents to [facility] today for low back and R lower leg pain ongoing since mid-March. Pt locates pain to right side lower back radiating into the R LE. Experiencing more pain in the calf. Denies increased warmth, swelling, erythema. Sx have responded well with Chiro treatments and massage. Broke humerus in Jan, feels that sx were exacerbated just before coming out of the cast, was unable to do self body work.    Radiating pain: R LE LE numbness/tingling: denies LE weakness: intermittent Aggravates: sx are constant, ambulation Treatments tried: topical  NSAIDs, Chiropractic adjustments   03/27/2024 Patient states she is a little better. Stabbing pain in the calf . PT has helped with the back not so much the calf    05/03/2024 Patient states she is a little bit better   06/14/2024 Patient states not feeling great. The right side is feeling better but now having problems on the left side. Left side is similar to the right sided problems from before. Activities are now starting to be limited like walking. Certain movements make the pain worse.    Relevant Historical Information: None pertinent  Additional pertinent review of systems negative.   Current Outpatient Medications:    cetirizine (ZYRTEC) 10 MG tablet, Take 10 mg by mouth daily., Disp: , Rfl:    escitalopram  (LEXAPRO ) 10 MG tablet, TAKE 1 TABLET(10 MG) BY MOUTH DAILY, Disp: 90 tablet, Rfl: 2   hydrOXYzine  (VISTARIL ) 25 MG capsule, TAKE 1 CAPSULE(25 MG) BY MOUTH EVERY 8 HOURS AS NEEDED FOR ANXIETY, Disp: 30 capsule, Rfl: 0   meloxicam  (MOBIC ) 15 MG tablet, Take 1 tablet (15 mg total) by mouth daily as needed for pain. Take 1 tablet daily for 2 weeks.  If still in pain after 2 weeks, take 1 tablet daily for an additional 1 week., Disp: 30 tablet, Rfl: 0   Multiple Vitamins-Minerals (MULTIVITAMIN WITH MINERALS) tablet, Take 1 tablet by mouth daily., Disp: , Rfl:    triamcinolone  cream (KENALOG ) 0.1 %, Apply 1 Application topically 2 (two) times daily. For eczema, Disp: 30 g, Rfl: 1   Objective:     Vitals:  06/14/24 1257  BP: 132/60  Pulse: 68  SpO2: 100%  Weight: 151 lb (68.5 kg)  Height: 5' 3 (1.6 m)      Body mass index is 26.75 kg/m.    Physical Exam:    Gen: Appears well, nad, nontoxic and pleasant Psych: Alert and oriented, appropriate mood and affect Neuro: sensation intact, strength is 5/5 in upper and lower extremities, muscle tone wnl Skin: no susupicious lesions or rashes   Back - Normal skin, Spine with normal alignment and no deformity.   Mild tenderness  to vertebral process palpation.   Paraspinous muscles are   tender and without spasm Mild bilateral TTP gluteal musculature Straight leg raise positive bilateral Trendelenberg positive bilateral Piriformis Test negative Gait normal     Electronically signed by:  Odis Mace [name] 1:18 PM 06/14/24

## 2024-06-14 ENCOUNTER — Ambulatory Visit (INDEPENDENT_AMBULATORY_CARE_PROVIDER_SITE_OTHER): Admitting: Sports Medicine

## 2024-06-14 VITALS — BP 132/60 | HR 68 | Ht 63.0 in | Wt 151.0 lb

## 2024-06-14 DIAGNOSIS — M5441 Lumbago with sciatica, right side: Secondary | ICD-10-CM | POA: Diagnosis not present

## 2024-06-14 DIAGNOSIS — M5442 Lumbago with sciatica, left side: Secondary | ICD-10-CM

## 2024-06-14 DIAGNOSIS — M79661 Pain in right lower leg: Secondary | ICD-10-CM | POA: Diagnosis not present

## 2024-06-14 DIAGNOSIS — G5701 Lesion of sciatic nerve, right lower limb: Secondary | ICD-10-CM | POA: Diagnosis not present

## 2024-06-14 DIAGNOSIS — G8929 Other chronic pain: Secondary | ICD-10-CM

## 2024-06-14 MED ORDER — MELOXICAM 15 MG PO TABS: 15.0000 mg | ORAL_TABLET | ORAL | 0 refills | Status: DC | PRN

## 2024-06-14 MED ORDER — METHYLPREDNISOLONE 4 MG PO TBPK: ORAL_TABLET | ORAL | 0 refills | Status: AC

## 2024-06-14 NOTE — Patient Instructions (Signed)
-   Use meloxicam  15 mg daily as needed for breakthrough pain.  Recommend limiting chronic NSAIDs to 1-2 doses per week to prevent long-term side effects. Use Tylenol  500 to 1000 mg tablets 2-3 times a day as needed for day-to-day pain relief.    Prednisone  dose pack. Continue Hep and physical activity as tolerated. Lumbar MRI without contrast. Follow up one week after MRI.

## 2024-06-19 ENCOUNTER — Ambulatory Visit (INDEPENDENT_AMBULATORY_CARE_PROVIDER_SITE_OTHER): Admitting: Internal Medicine

## 2024-06-19 VITALS — BP 148/96 | HR 65 | Temp 98.7°F | Ht 62.64 in | Wt 148.2 lb

## 2024-06-19 DIAGNOSIS — Z23 Encounter for immunization: Secondary | ICD-10-CM

## 2024-06-19 DIAGNOSIS — Z1211 Encounter for screening for malignant neoplasm of colon: Secondary | ICD-10-CM | POA: Diagnosis not present

## 2024-06-19 DIAGNOSIS — R030 Elevated blood-pressure reading, without diagnosis of hypertension: Secondary | ICD-10-CM | POA: Diagnosis not present

## 2024-06-19 DIAGNOSIS — Z0000 Encounter for general adult medical examination without abnormal findings: Secondary | ICD-10-CM

## 2024-06-19 DIAGNOSIS — Z8249 Family history of ischemic heart disease and other diseases of the circulatory system: Secondary | ICD-10-CM

## 2024-06-19 NOTE — Progress Notes (Signed)
 Chief Complaint  Patient presents with   Annual Exam    HPI: Patient  Michele Andersen  48 y.o. comes in today for Preventive Health Care visit  [place] doing well on this  Home  bp a bit high  last time ok   has gained some weight since sciatica  currently on steroids and ocass use of nsaids  ocass ankle swelling but no cp sob  persistent sx  Family hx is positive for HT  and mo has A fib.    Health Maintenance  Topic Date Due   HIV Screening  Never done   Hepatitis C Screening  Never done   Hepatitis B Vaccines 19-59 Average Risk (1 of 3 - 19+ 3-dose series) Never done   Cervical Cancer Screening (HPV/Pap Cotest)  04/08/2023   COVID-19 Vaccine (6 - 2025-26 season) 07/05/2024 (Originally 04/22/2024)   Colonoscopy  08/19/2024 (Originally 07/09/2019)   Mammogram  04/26/2026   DTaP/Tdap/Td (3 - Td or Tdap) 05/11/2032   Influenza Vaccine  Completed   Pneumococcal Vaccine  Aged Out   HPV VACCINES  Aged Out   Meningococcal B Vaccine  Aged Out   Health Maintenance Review LIFESTYLE:  Exercise:   trying to walk and scaitica .  Tobacco/ETS:  n Alcohol:   min Sugar beverages: tea with honey  Sleep:  average 7 hours  Drug use: no HH of   2  2 cats  Work:  [employer]  40 .  Hours  Had ablation for bleeding and fibroids  has a gyne Sis x of blood clot neg for gi cancer   ROS:  GEN/ HEENT: No fever, significant weight changes sweats headaches vision problems hearing changes, CV/ PULM; No chest pain shortness of breath cough, syncope,edema  change in exercise tolerance. GI /GU: No adominal pain, vomiting, change in bowel habits. No blood in the stool. No significant GU symptoms. SKIN/HEME: ,no acute skin rashes suspicious lesions or bleeding. No lymphadenopathy, nodules, masses.  NEURO/ PSYCH:  No neurologic signs such as weakness numbness. No depression anxiety. IMM/ Allergy: No unusual infections.  Allergy .   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   DILITATION & CURRETTAGE/HYSTROSCOPY WITH HYDROTHERMAL ABLATION N/A 07/21/2022   Procedure: DILATATION & CURETTAGE/HYSTEROSCOPY WITH HYDROTHERMAL ABLATION;  Surgeon: Barbette Knock, MD;  Location[facility];  Service: Gynecology;  Laterality: N/A;   ELBOW SURGERY Right 12/07/2015   @[facility]   by dr d. sebastian;    RIGHT EPICONDYLAR DEBRIDEMENT AND RADIAL NEUROPLASTY   WRIST GANGLION EXCISION Right 12/13/2019   @  [facility]   by   dr d. sebastian;    AND RIGHT EPICONDYLAR DEBRIDEMENT W/ RADIAL NEUROPLASTY    Family History  Problem Relation Age of Onset   Hyperlipidemia Mother    Diabetes Father    Hyperlipidemia Father     Social History   Socioeconomic History   Marital status: Married    Spouse name: Not on file   Number of children: Not on file   Years of education: Not on file   Highest education level: Bachelor's degree (e.g., BA, AB, BS)  Occupational History   Not on file  Tobacco Use   Smoking status: Former    Current packs/day: 0.00    Average packs/day: 0.5 packs/day for  4.0 years (2.0 ttl pk-yrs)    Types: Cigarettes    Start date: 2000    Quit date: 2004    Years since quitting: 21.8   Smokeless tobacco: Never  Vaping Use   Vaping status: Never Used  Substance and Sexual Activity   Alcohol use: Yes    Comment: occ    Drug use: Never   Sexual activity: Not on file  Other Topics Concern   Not on file  Social History Narrative   Not on file   Social Drivers of Health   Financial Resource Strain: Low Risk  (06/19/2024)   Overall Financial Resource Strain (CARDIA)    Difficulty of Paying Living Expenses: Not very hard  Food Insecurity: No Food Insecurity (06/19/2024)   Hunger Vital Sign    Worried About Running Out of Food in the Last Year: Never true    Ran Out of Food in the Last Year: Never true  Transportation Needs: No Transportation Needs (06/19/2024)   PRAPARE - Administrator, Civil Service (Medical): No    Lack of Transportation (Non-Medical): No  Physical Activity: Insufficiently Active (06/19/2024)   Exercise Vital Sign    Days of Exercise per Week: 3 days    Minutes of Exercise per Session: 20 min  Stress: Stress Concern Present (06/19/2024)   [employer] of Occupational Health - Occupational Stress Questionnaire    Feeling of Stress: To some extent  Social Connections: Moderately Integrated (06/19/2024)   Social Connection and Isolation Panel    Frequency of Communication with Friends and Family: More than three times a week    Frequency of Social Gatherings with Friends and Family: Once a week    Attends Religious Services: More than 4 times per year    Active Member of [employer] or Organizations: No    Attends Engineer, Structural: Not on file    Marital Status: Married    Outpatient Medications Prior to Visit  Medication Sig Dispense Refill   cetirizine (ZYRTEC) 10 MG tablet Take 10 mg by mouth daily.     escitalopram  (LEXAPRO ) 10 MG tablet TAKE 1 TABLET(10 MG) BY MOUTH DAILY 90 tablet 2    hydrOXYzine  (VISTARIL ) 25 MG capsule TAKE 1 CAPSULE(25 MG) BY MOUTH EVERY 8 HOURS AS NEEDED FOR ANXIETY 30 capsule 0   meloxicam  (MOBIC ) 15 MG tablet Take 1 tablet (15 mg total) by mouth as needed for pain. Take 1 tablet daily for 2 weeks.  If still in pain after 2 weeks, take 1 tablet daily for an additional 1 week. 30 tablet 0   methylPREDNISolone (MEDROL DOSEPAK) 4 MG TBPK tablet Follow instructions on package. 21 tablet 0   Multiple Vitamins-Minerals (MULTIVITAMIN WITH MINERALS) tablet Take 1 tablet by mouth daily.  triamcinolone  cream (KENALOG ) 0.1 % Apply 1 Application topically 2 (two) times daily. For eczema 30 g 1   No facility-administered medications prior to visit.     EXAM:  BP (!) 148/96 (BP Location: Right Arm, Patient Position: Sitting, Cuff Size: Normal)   Pulse 65   Temp 98.7 F (37.1 C) (Oral)   Ht 5' 2.64 (1.591 m)   Wt 148 lb 3.2 oz (67.2 kg)   SpO2 98%   BMI 26.56 kg/m   Body mass index is 26.56 kg/m. Wt Readings from Last 3 Encounters:  06/19/24 148 lb 3.2 oz (67.2 kg)  06/14/24 151 lb (68.5 kg)  05/03/24 148 lb (67.1 kg)    Physical Exam: Vital signs reviewed HZW:Uypd is a well-developed well-nourished alert cooperative    who appearsr stated age in no acute distress.  HEENT: normocephalic atraumatic , Eyes: PERRL EOM's full, conjunctiva clear, Nares: paten,t no deformity discharge or tenderness., Ears: no deformity EAC's clear TMs with normal landmarks. Mouth: clear OP, no lesions, edema.  Moist mucous membranes. Dentition in adequate repair. NECK: supple without masses, thyromegaly or bruits. CHEST/PULM:  Clear to auscultation and percussion breath sounds equal no wheeze , rales or rhonchi. No chest wall deformities or tenderness. Breast: normal by inspection . No dimpling, discharge, masses, tenderness or discharge . CV: PMI is nondisplaced, S1 S2 no gallops, murmurs, rubs. Peripheral pulses are full without delay.No JVD .  ABDOMEN: Bowel sounds  normal nontender  No guard or rebound, no hepato splenomegal no CVA tenderness.  No hernia. Extremtities:  No clubbing cyanosis or edema, no acute joint swelling or redness no focal atrophy NEURO:  Oriented x3, cranial nerves 3-12 appear to be intact, no obvious focal weakness,gait within normal limits no abnormal reflexes or asymmetrical SKIN: No acute rashes normal turgor, color, no bruising or petechiae. PSYCH: Oriented, good eye contact, no obvious depression anxiety, cognition and judgment appear normal. LN: no cervical axillary inguinal adenopathy  Lab Results  Component Value Date   WBC 7.3 06/11/2024   HGB 13.6 06/11/2024   HCT 40.6 06/11/2024   PLT 316.0 06/11/2024   GLUCOSE 88 06/11/2024   CHOL 173 06/11/2024   TRIG 150.0 (H) 06/11/2024   HDL 43.70 06/11/2024   LDLCALC 100 (H) 06/11/2024   ALT 18 06/11/2024   AST 14 06/11/2024   NA 138 06/11/2024   K 3.8 06/11/2024   CL 104 06/11/2024   CREATININE 0.64 06/11/2024   BUN 16 06/11/2024   CO2 26 06/11/2024   TSH 0.81 06/11/2024   HGBA1C 5.1 06/11/2024    BP Readings from Last 3 Encounters:  06/19/24 (!) 148/96  06/14/24 132/60  02/14/24 102/68    Lab results reviewed with patient   ASSESSMENT AND PLAN:  Discussed the following assessment and plan:    ICD-10-CM   1. Visit for preventive health examination  Z00.00     2. Elevated BP without diagnosis of hypertension  R03.0     3. Influenza vaccine needed  Z23 Flu vaccine trivalent PF, 6mos and older(Flulaval,Afluria,Fluarix,Fluzone)    4. Screening for colon cancer  Z12.11 Cologuard    5. Family history of hypertension  Z82.49     May have    HT by hx  get readings and send in to my chart  lsi for now  and consideration of meds in future if indicated  goals discussed .   Current meds ok to continue   Sciatica currently limiting activty a bit  a bit  Return  in about 1 year (around 06/19/2025) for depending on  bp readings  .  Patient Care Team: Zayin Valadez,  Sabah Zucco K, MD as PCP - General Patient Instructions  Good to see you today   Attention toBP readings   goal best is about 120/80 or at least 130/80 range  Take blood pressure readings twice a day for 3-5  days and record .     Take 2 -3 readings at each sitting .   Can send in readings  by My Chart.    instructions  Before checking your blood pressure make sure: You are seated and quite for 5 min before checking Feet are flat on the floor Siting in chair with your back supported straight up and down Arm resting on table or arm of chair at heart level Bladder is empty You have NOT had caffeine or tobacco within the last 30 min  [email or web address]  If not controlled we can have you fu and consider medication as indicated   Look into DASH eating    Apolinar K. Temisha Murley M.D.

## 2024-06-19 NOTE — Patient Instructions (Signed)
 Good to see you today   Attention toBP readings   goal best is about 120/80 or at least 130/80 range  Take blood pressure readings twice a day for 3-5  days and record .     Take 2 -3 readings at each sitting .   Can send in readings  by My Chart.    instructions  Before checking your blood pressure make sure: You are seated and quite for 5 min before checking Feet are flat on the floor Siting in chair with your back supported straight up and down Arm resting on table or arm of chair at heart level Bladder is empty You have NOT had caffeine or tobacco within the last 30 min  [email or web address]  If not controlled we can have you fu and consider medication as indicated   Look into DASH eating

## 2024-06-20 ENCOUNTER — Other Ambulatory Visit

## 2024-06-20 DIAGNOSIS — Z006 Encounter for examination for normal comparison and control in clinical research program: Secondary | ICD-10-CM

## 2024-06-29 LAB — GENECONNECT MOLECULAR SCREEN: Genetic Analysis Overall Interpretation: NEGATIVE

## 2024-06-30 ENCOUNTER — Ambulatory Visit
Admission: RE | Admit: 2024-06-30 | Discharge: 2024-06-30 | Disposition: A | Source: Ambulatory Visit | Attending: Sports Medicine

## 2024-06-30 DIAGNOSIS — G8929 Other chronic pain: Secondary | ICD-10-CM

## 2024-07-01 ENCOUNTER — Ambulatory Visit: Payer: Self-pay | Admitting: Sports Medicine

## 2024-07-08 NOTE — Progress Notes (Unsigned)
 [facility] [address] [place] [address] Phone: [phone]   Assessment and Plan:     1. Chronic bilateral low back pain with bilateral sciatica (Primary) 2. Lumbar disc herniation with radiculopathy -Chronic with exacerbation, subsequent visit - Continued low back pain with bilateral radicular symptoms, currently worse into left lower extremity compared to right.  Most consistent with lumbar DDD, lumbar disc herniation, facet cysts primarily at L4-L5 - Recommend epidural CSI to left-sided L4-L5.  Order placed - Use meloxicam  15 mg daily as needed for breakthrough pain.  Recommend limiting chronic NSAIDs to 1-2 doses per week to prevent long-term side effects. Use Tylenol  500 to 1000 mg tablets 2-3 times a day as needed for day-to-day pain relief.    - Patient completed prednisone  pack with no significant relief of symptoms - Start gabapentin 100 to 200 mg 1-2 times a day as needed for neurologic symptoms.  Prescription sent to pharmacy - Continue HEP as tolerated  3. Pelvic mass -Incidental finding, initial visit - Incidental finding of partially imaged cystic structure in upper left hemipelvis seen on lumbar MRI from 06/30/2024 with recommendation of CT abdomen pelvis with and without contrast to further characterize.  Most consistent with adnexal cyst based on radiology review.  Patient has history of fibroids - We will order CT abdomen pelvis with and without contrast for further evaluation    Pertinent previous records reviewed include lumbar MRI   Follow Up: 2 weeks after epidural to review benefit.  Could order additional epidural if necessary.  Will contact patient once CT has resulted to discuss findings   Subjective:   I, Chestine Andersen, am serving as a neurosurgeon for Doctor Morene Mace  Chief Complaint: back pain    HPI:    02/14/2024 Michele Andersen is a 48 y.o. female who presents to [facility] at [facility] today for low back and R lower leg pain ongoing since mid-March. Pt locates pain to right side lower back radiating into the R LE. Experiencing more pain in the calf. Denies increased warmth, swelling, erythema. Sx have responded well with Chiro treatments and massage. Broke humerus in Jan, feels that sx were exacerbated just before coming out of the cast, was unable to do self body work.    Radiating pain: R LE LE numbness/tingling: denies LE weakness: intermittent Aggravates: sx are constant, ambulation Treatments tried: topical NSAIDs, Chiropractic adjustments   03/27/2024 Patient states she is a little better. Stabbing pain in the calf . PT has helped with the back not so much the calf    05/03/2024 Patient states she is a little bit better    06/14/2024 Patient states not feeling great. The right side is feeling better but now having problems on the left side. Left side is similar to the right sided problems from before. Activities are now starting to be limited like walking. Certain movements make the pain worse.   07/09/2024 Patient states she is ehhh    Relevant Historical Information: None pertinent    Additional pertinent review of systems negative.   Current Outpatient Medications:    gabapentin (NEURONTIN) 100 MG capsule, Take 1 capsule (100 mg total) by mouth 2 (two) times daily., Disp: 30 capsule, Rfl: 1   cetirizine (ZYRTEC) 10 MG tablet, Take 10 mg by mouth daily., Disp: , Rfl:    escitalopram  (LEXAPRO ) 10 MG tablet, TAKE 1 TABLET(10 MG) BY MOUTH DAILY, Disp: 90 tablet, Rfl: 2  hydrOXYzine  (VISTARIL ) 25 MG capsule, TAKE 1 CAPSULE(25 MG) BY MOUTH EVERY 8 HOURS AS NEEDED FOR ANXIETY, Disp: 30 capsule, Rfl: 0   meloxicam  (MOBIC ) 15 MG tablet, Take 1 tablet (15 mg total) by mouth as needed for pain. Take 1 tablet daily for 2 weeks.  If still in pain after 2 weeks, take 1 tablet daily for an additional 1 week., Disp: 30 tablet, Rfl: 0    methylPREDNISolone (MEDROL DOSEPAK) 4 MG TBPK tablet, Follow instructions on package., Disp: 21 tablet, Rfl: 0   Multiple Vitamins-Minerals (MULTIVITAMIN WITH MINERALS) tablet, Take 1 tablet by mouth daily., Disp: , Rfl:    triamcinolone  cream (KENALOG ) 0.1 %, Apply 1 Application topically 2 (two) times daily. For eczema, Disp: 30 g, Rfl: 1   Objective:     Vitals:   07/09/24 1123  BP: 124/82  Pulse: 73  SpO2: 97%  Weight: 143 lb (64.9 kg)  Height: 5' 2 (1.575 m)      Body mass index is 26.16 kg/m.    Physical Exam:    Gen: Appears well, nad, nontoxic and pleasant Psych: Alert and oriented, appropriate mood and affect Neuro: sensation intact, strength is 5/5 in upper and lower extremities, muscle tone wnl Skin: no susupicious lesions or rashes   Back - Normal skin, Spine with normal alignment and no deformity.   Mild tenderness to vertebral process palpation.   Paraspinous muscles are   tender and without spasm Mild bilateral TTP gluteal musculature Straight leg raise positive bilateral Trendelenberg positive bilateral Piriformis Test negative Gait normal     Electronically signed by:  Odis Mace [name] 1:14 PM 07/09/24

## 2024-07-09 ENCOUNTER — Ambulatory Visit: Admitting: Sports Medicine

## 2024-07-09 VITALS — BP 124/82 | HR 73 | Ht 62.0 in | Wt 143.0 lb

## 2024-07-09 DIAGNOSIS — R1900 Intra-abdominal and pelvic swelling, mass and lump, unspecified site: Secondary | ICD-10-CM | POA: Diagnosis not present

## 2024-07-09 DIAGNOSIS — G8929 Other chronic pain: Secondary | ICD-10-CM | POA: Diagnosis not present

## 2024-07-09 DIAGNOSIS — M5116 Intervertebral disc disorders with radiculopathy, lumbar region: Secondary | ICD-10-CM | POA: Diagnosis not present

## 2024-07-09 MED ORDER — GABAPENTIN 100 MG PO CAPS: 100.0000 mg | ORAL_CAPSULE | Freq: Two times a day (BID) | ORAL | 1 refills | Status: DC

## 2024-07-09 NOTE — Patient Instructions (Addendum)
 Epidural left L4-5  Gabapentin 100-200 mg 1-2x a day   CT abdomin and pelvis   Follow up 2 weeks after injection to discuss results

## 2024-07-10 ENCOUNTER — Telehealth: Payer: Self-pay

## 2024-07-10 ENCOUNTER — Encounter: Payer: Self-pay | Admitting: Sports Medicine

## 2024-07-10 DIAGNOSIS — R1900 Intra-abdominal and pelvic swelling, mass and lump, unspecified site: Secondary | ICD-10-CM

## 2024-07-10 DIAGNOSIS — M5116 Intervertebral disc disorders with radiculopathy, lumbar region: Secondary | ICD-10-CM

## 2024-07-10 DIAGNOSIS — G8929 Other chronic pain: Secondary | ICD-10-CM

## 2024-07-10 NOTE — Telephone Encounter (Signed)
Order changed to with contrast

## 2024-07-10 NOTE — Telephone Encounter (Signed)
 [facility] called and talked to julie they wanted us  to change the Ct order to just with not with and without. Are you okay with that. They didn't give any reason why.

## 2024-07-12 ENCOUNTER — Ambulatory Visit
Admission: RE | Admit: 2024-07-12 | Discharge: 2024-07-12 | Disposition: A | Discharge status: Home / Self Care | Source: Ambulatory Visit | Attending: Sports Medicine | Admitting: Sports Medicine

## 2024-07-12 DIAGNOSIS — G8929 Other chronic pain: Secondary | ICD-10-CM

## 2024-07-12 DIAGNOSIS — R1900 Intra-abdominal and pelvic swelling, mass and lump, unspecified site: Secondary | ICD-10-CM

## 2024-07-12 DIAGNOSIS — M5116 Intervertebral disc disorders with radiculopathy, lumbar region: Secondary | ICD-10-CM

## 2024-07-12 MED ORDER — IOPAMIDOL (ISOVUE-370) INJECTION 76%
80.0000 mL | Freq: Once | INTRAVENOUS | Status: AC | PRN
  Administered 2024-07-12: 80 mL via INTRAVENOUS

## 2024-07-22 ENCOUNTER — Ambulatory Visit: Payer: Self-pay | Admitting: Sports Medicine

## 2024-07-29 ENCOUNTER — Encounter: Payer: Self-pay | Admitting: Sports Medicine

## 2024-08-01 NOTE — Discharge Instructions (Signed)

## 2024-08-02 ENCOUNTER — Ambulatory Visit
Admission: RE | Admit: 2024-08-02 | Discharge: 2024-08-02 | Disposition: A | Source: Ambulatory Visit | Attending: Sports Medicine

## 2024-08-02 DIAGNOSIS — G8929 Other chronic pain: Secondary | ICD-10-CM

## 2024-08-02 DIAGNOSIS — M5116 Intervertebral disc disorders with radiculopathy, lumbar region: Secondary | ICD-10-CM

## 2024-08-02 DIAGNOSIS — R1900 Intra-abdominal and pelvic swelling, mass and lump, unspecified site: Secondary | ICD-10-CM

## 2024-08-02 MED ORDER — IOPAMIDOL (ISOVUE-M 200) INJECTION 41%
1.0000 mL | Freq: Once | INTRAMUSCULAR | Status: AC
  Administered 2024-08-02: 1 mL via EPIDURAL

## 2024-08-02 MED ORDER — METHYLPREDNISOLONE ACETATE 40 MG/ML INJ SUSP (RADIOLOG
80.0000 mg | Freq: Once | INTRAMUSCULAR | Status: AC
  Administered 2024-08-02: 14:00:00 80 mg via EPIDURAL

## 2024-08-07 ENCOUNTER — Other Ambulatory Visit: Payer: Self-pay | Admitting: Family

## 2024-08-12 NOTE — Progress Notes (Unsigned)
 "               Michele Andersen [name] [address] [place] [address] Phone: [phone]   Assessment and Plan:     1. Chronic bilateral low back pain with bilateral sciatica (Primary) 2. Lumbar disc herniation with radiculopathy -Chronic with exacerbation, subsequent visit - Overall significant improvement in low back pain and left-sided radicular symptoms after epidural CSI performed on 08/02/2024.  Patient felt 80% improvement in symptoms, decreased need for pain medication, and improved function after injection - No patient's left-sided lower back pain and left radicular symptoms have improved, she continues to experience right sided low back pain and right-sided radicular symptoms.  Recommend right sided epidural CSI to L4-L5 - May continue gabapentin  100 to 200 mg 1-2 times a day as needed for neurologic symptoms - Use meloxicam  15 mg daily as needed for breakthrough pain.  Recommend limiting chronic NSAIDs to 1-2 doses per week to prevent long-term side effects. Use Tylenol  500 to 1000 mg tablets 2-3 times a day as needed for day-to-day pain relief.     3. Pelvic mass -Incidental finding, subsequent visit - CT scan for further evaluation showed a simple left ovarian cyst with possible 1.2 loculated cyst - Patient has reached out to OB/GYN and is scheduled for ultrasound and follow-up to monitor.  Agree with this workup/treatment plan.  No further imaging or workup to be ordered on my end    Pertinent previous records reviewed include CT scan 11/21/ 25, epidural procedure note 08/02/2024   Follow Up: 2 weeks after epidural to review benefit.  Could consider third epidural if necessary   Subjective:   I, Michele Andersen, am serving as a neurosurgeon for Doctor Morene Andersen   Chief Complaint: back pain    HPI:    02/14/2024 Michele Andersen is a 48 y.o. female who presents to [facility] at [facility] today for low back and R  lower leg pain ongoing since mid-March. Pt locates pain to right side lower back radiating into the R LE. Experiencing more pain in the calf. Denies increased warmth, swelling, erythema. Sx have responded well with Chiro treatments and massage. Broke humerus in Jan, feels that sx were exacerbated just before coming out of the cast, was unable to do self body work.    Radiating pain: R LE LE numbness/tingling: denies LE weakness: intermittent Aggravates: sx are constant, ambulation Treatments tried: topical NSAIDs, Chiropractic adjustments   03/27/2024 Patient states she is a little better. Stabbing pain in the calf . PT has helped with the back not so much the calf    05/03/2024 Patient states she is a little bit better    06/14/2024 Patient states not feeling great. The right side is feeling better but now having problems on the left side. Left side is similar to the right sided problems from before. Activities are now starting to be limited like walking. Certain movements make the pain worse.    07/09/2024 Patient states she is ehhh   08/13/2024 Patient states epidural has helped left side. Right sided pain is back    Relevant Historical Information: None pertinent  Additional pertinent review of systems negative.  Current Medications[1]   Objective:     Vitals:   08/13/24 1600  Pulse: 70  SpO2: 98%  Weight: 146 lb (66.2 kg)  Height: 5' 2 (1.575 m)      Body mass index is 26.7 kg/m.  Physical Exam:    Gen: Appears well, nad, nontoxic and pleasant Psych: Alert and oriented, appropriate mood and affect Neuro: sensation intact, strength is 5/5 in upper and lower extremities, muscle tone wnl Skin: no susupicious lesions or rashes   Back - Normal skin, Spine with normal alignment and no deformity.   Mild tenderness to vertebral process palpation.   Paraspinous muscles are   tender and without spasm Mild bilateral TTP gluteal musculature Straight leg raise positive  right Trendelenberg positive right Piriformis Test negative Gait normal     Electronically signed by:  Michele Andersen [name] 4:18 PM 08/13/2024     [1]  Current Outpatient Medications:    cetirizine (ZYRTEC) 10 MG tablet, Take 10 mg by mouth daily., Disp: , Rfl:    escitalopram  (LEXAPRO ) 10 MG tablet, TAKE 1 TABLET(10 MG) BY MOUTH DAILY, Disp: 90 tablet, Rfl: 2   gabapentin  (NEURONTIN ) 100 MG capsule, Take 1 capsule (100 mg total) by mouth 2 (two) times daily., Disp: 30 capsule, Rfl: 1   hydrOXYzine  (VISTARIL ) 25 MG capsule, TAKE 1 CAPSULE(25 MG) BY MOUTH EVERY 8 HOURS AS NEEDED FOR ANXIETY, Disp: 30 capsule, Rfl: 0   meloxicam  (MOBIC ) 15 MG tablet, Take 1 tablet (15 mg total) by mouth as needed for pain. Take 1 tablet daily for 2 weeks.  If still in pain after 2 weeks, take 1 tablet daily for an additional 1 week., Disp: 30 tablet, Rfl: 0   methylPREDNISolone  (MEDROL  DOSEPAK) 4 MG TBPK tablet, Follow instructions on package., Disp: 21 tablet, Rfl: 0   Multiple Vitamins-Minerals (MULTIVITAMIN WITH MINERALS) tablet, Take 1 tablet by mouth daily., Disp: , Rfl:    triamcinolone  cream (KENALOG ) 0.1 %, Apply 1 Application topically 2 (two) times daily. For eczema, Disp: 30 g, Rfl: 1  "

## 2024-08-13 ENCOUNTER — Ambulatory Visit: Admitting: Sports Medicine

## 2024-08-13 VITALS — HR 70 | Ht 62.0 in | Wt 146.0 lb

## 2024-08-13 DIAGNOSIS — R1900 Intra-abdominal and pelvic swelling, mass and lump, unspecified site: Secondary | ICD-10-CM

## 2024-08-13 DIAGNOSIS — M5116 Intervertebral disc disorders with radiculopathy, lumbar region: Secondary | ICD-10-CM

## 2024-08-13 DIAGNOSIS — G8929 Other chronic pain: Secondary | ICD-10-CM

## 2024-08-13 NOTE — Patient Instructions (Signed)
 Epidural right L4-5  Follow up 2 weeks after to discuss results   Continue follow up with OBGYN

## 2024-08-26 ENCOUNTER — Other Ambulatory Visit: Payer: Self-pay | Admitting: Sports Medicine

## 2024-09-01 ENCOUNTER — Other Ambulatory Visit: Payer: Self-pay | Admitting: Sports Medicine

## 2024-09-03 ENCOUNTER — Other Ambulatory Visit

## 2024-09-11 NOTE — Discharge Instructions (Signed)

## 2024-09-12 ENCOUNTER — Inpatient Hospital Stay
Admission: RE | Admit: 2024-09-12 | Discharge: 2024-09-12 | Disposition: A | Discharge status: Home / Self Care | Source: Ambulatory Visit | Attending: Sports Medicine | Admitting: Sports Medicine

## 2024-09-19 NOTE — Discharge Instructions (Signed)

## 2024-09-20 ENCOUNTER — Inpatient Hospital Stay
Admission: RE | Admit: 2024-09-20 | Discharge: 2024-09-20 | Disposition: A | Source: Ambulatory Visit | Attending: Sports Medicine

## 2024-09-20 NOTE — Discharge Instructions (Signed)

## 2024-09-24 ENCOUNTER — Inpatient Hospital Stay
Admission: RE | Admit: 2024-09-24 | Discharge: 2024-09-24 | Disposition: A | Source: Ambulatory Visit | Attending: Sports Medicine

## 2024-09-24 DIAGNOSIS — M5116 Intervertebral disc disorders with radiculopathy, lumbar region: Secondary | ICD-10-CM

## 2024-09-24 DIAGNOSIS — G8929 Other chronic pain: Secondary | ICD-10-CM

## 2024-09-24 DIAGNOSIS — R1900 Intra-abdominal and pelvic swelling, mass and lump, unspecified site: Secondary | ICD-10-CM

## 2024-09-24 MED ORDER — METHYLPREDNISOLONE ACETATE 40 MG/ML INJ SUSP (RADIOLOG
80.0000 mg | Freq: Once | INTRAMUSCULAR | Status: AC
  Administered 2024-09-24: 80 mg via EPIDURAL

## 2024-09-24 MED ORDER — IOPAMIDOL (ISOVUE-M 200) INJECTION 41%
1.0000 mL | Freq: Once | INTRAMUSCULAR | Status: AC
  Administered 2024-09-24: 1 mL via EPIDURAL
